# Patient Record
Sex: Female | Born: 1980 | Race: White | Hispanic: No | Marital: Married | Smoking: Never smoker
Health system: Southern US, Academic
[De-identification: ages and names within clinical notes are randomized; demographics above are authoritative.]

## PROBLEM LIST (undated history)

## (undated) DIAGNOSIS — Z8742 Personal history of other diseases of the female genital tract: Secondary | ICD-10-CM

## (undated) DIAGNOSIS — M109 Gout, unspecified: Secondary | ICD-10-CM

## (undated) DIAGNOSIS — I10 Essential (primary) hypertension: Secondary | ICD-10-CM

## (undated) HISTORY — PX: HX CARPAL TUNNEL RELEASE: SHX101

## (undated) HISTORY — PX: BARIATRIC SURGERY: SHX1103

---

## 1999-12-27 ENCOUNTER — Emergency Department (HOSPITAL_COMMUNITY): Payer: Self-pay | Admitting: EXTERNAL

## 2015-08-29 IMAGING — CT CT HEAD/BRAIN W/O DYE
1 of 2 series · 16 of 30 positions shown, 20 images · non-contrast
Comparison: None.

Exam:
CT Head without Contrast
INDICATION: Migraine headaches.
TECHNIQUE: Axial non-contrast CT imaging was performed from skull base to the vertex.

[axial · axial · 0.49mm/px · z∈[-76,+68]mm · 16 of 56 slices shown, 20 images]
[im 4/56  brain]
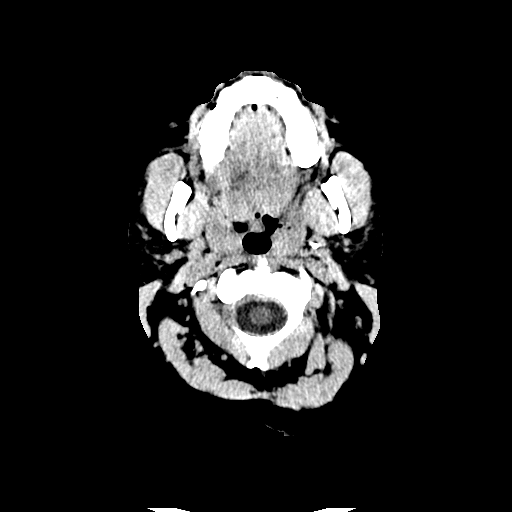
[im 4/56  bone]
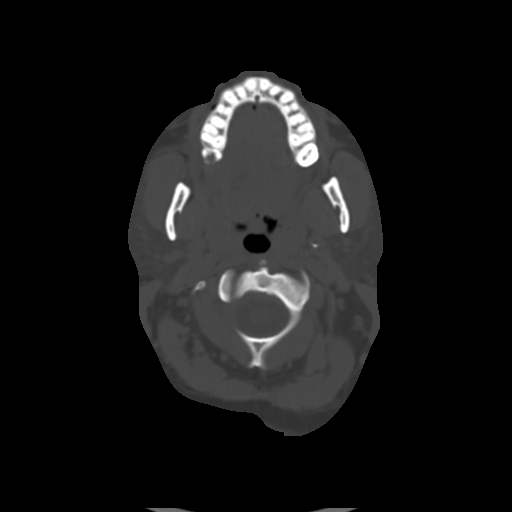
[im 7/56  brain]
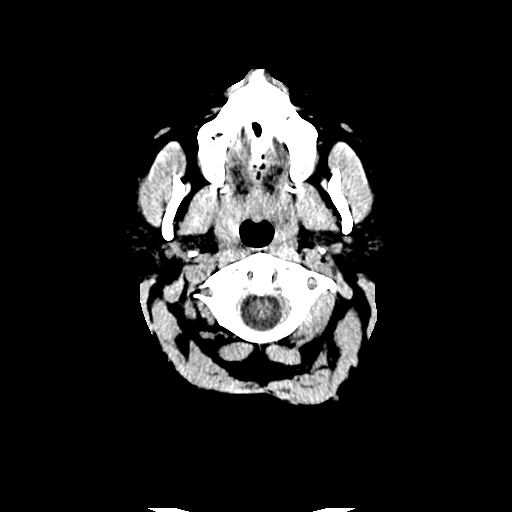
[im 10/56  brain]
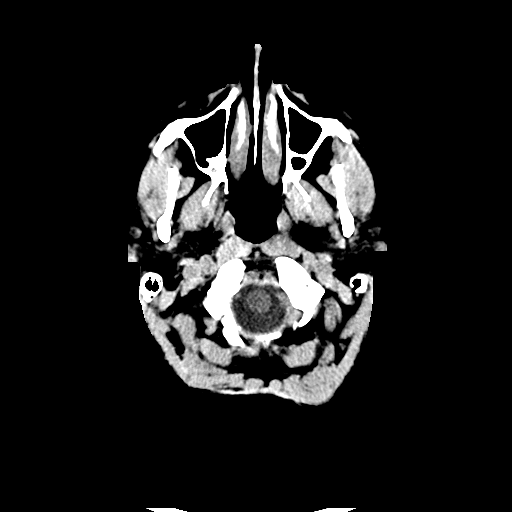
[im 13/56  brain]
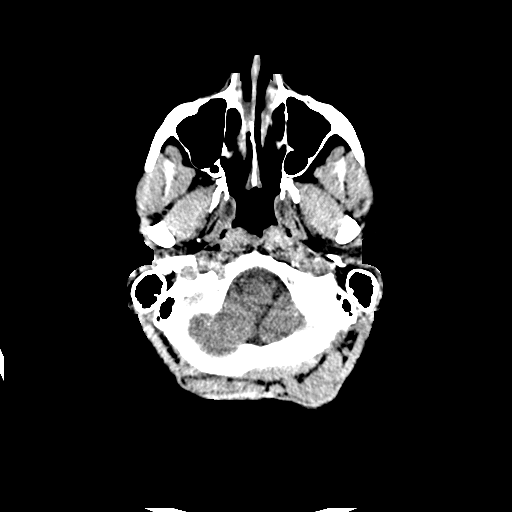
[im 16/56  brain]
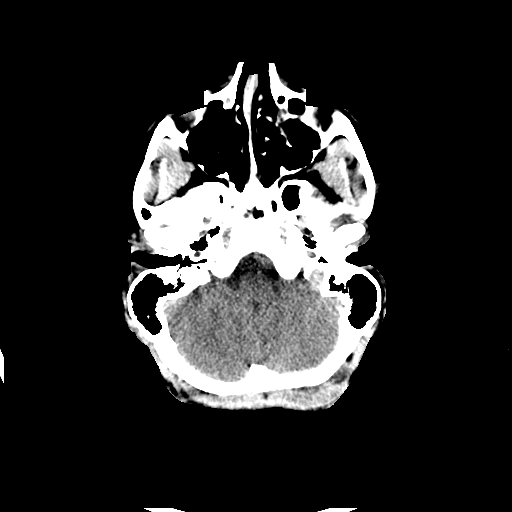
[im 16/56  bone]
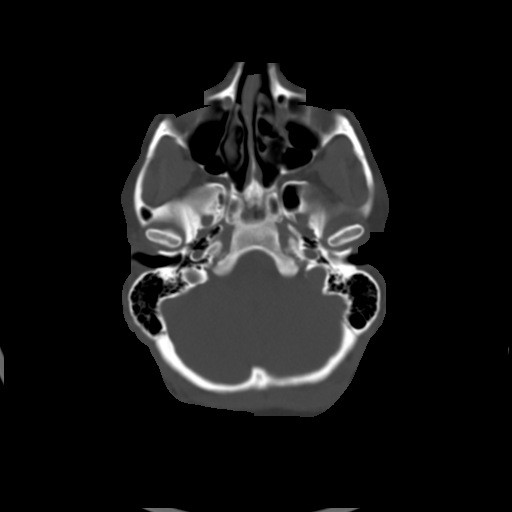
[im 19/56  brain]
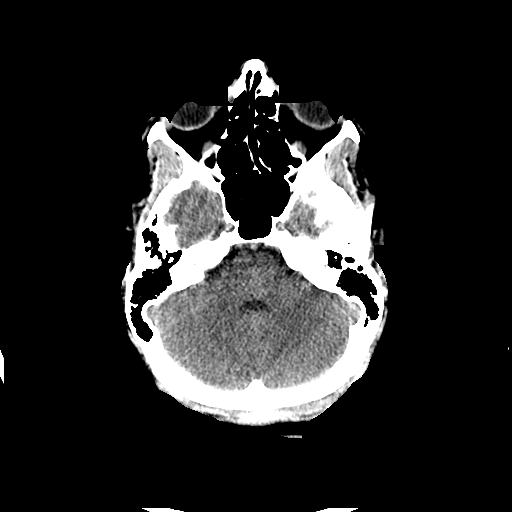
[im 22/56  brain]
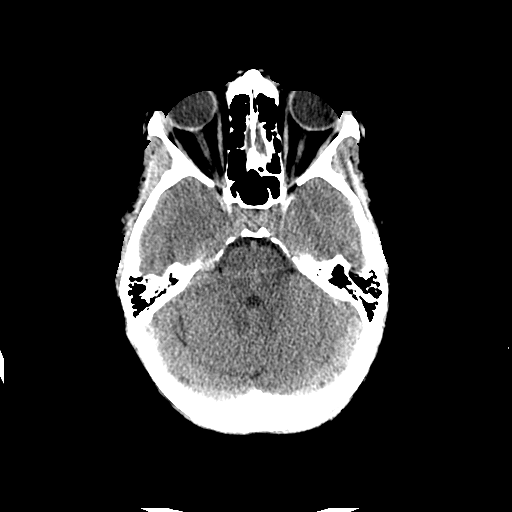
[im 25/56  brain]
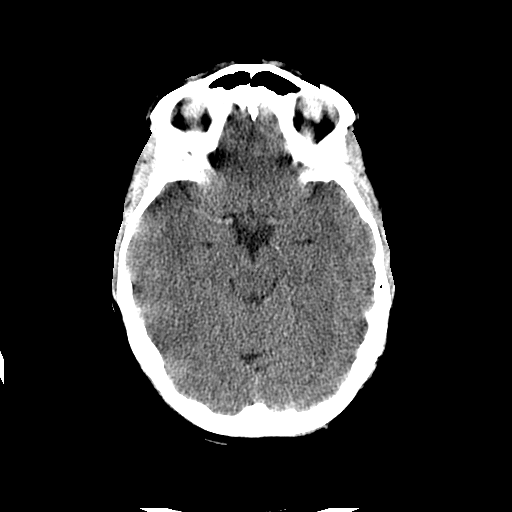
[im 31/56  brain]
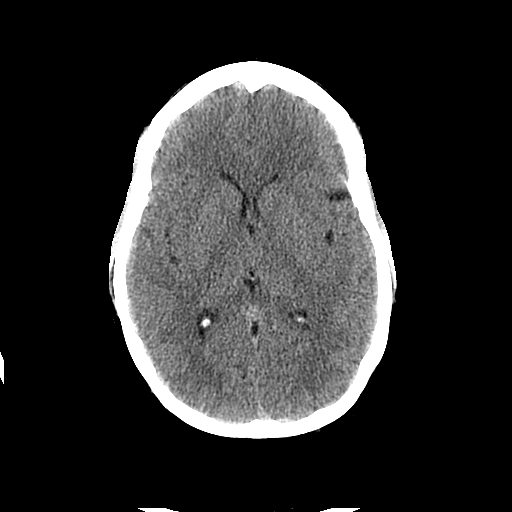
[im 31/56  bone]
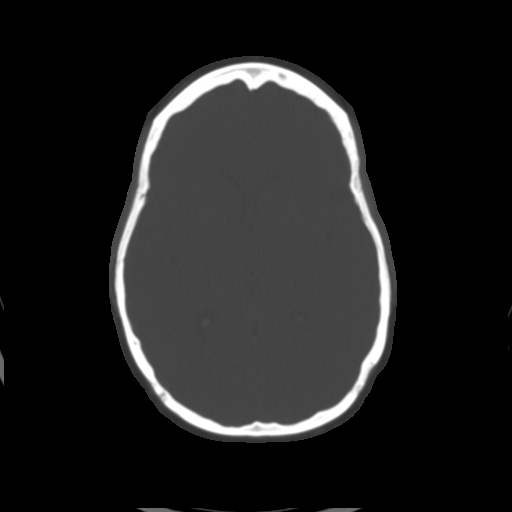
[im 34/56  brain]
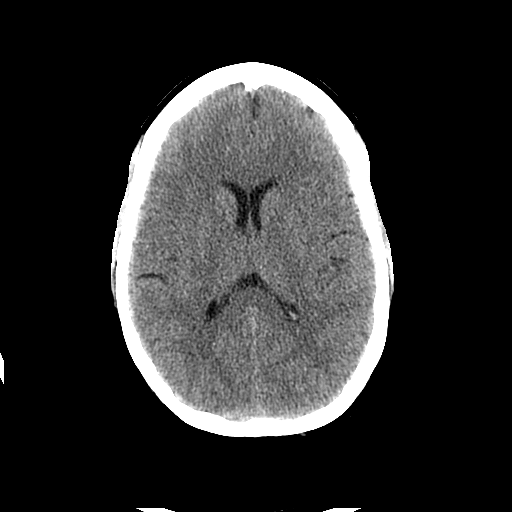
[im 37/56  brain]
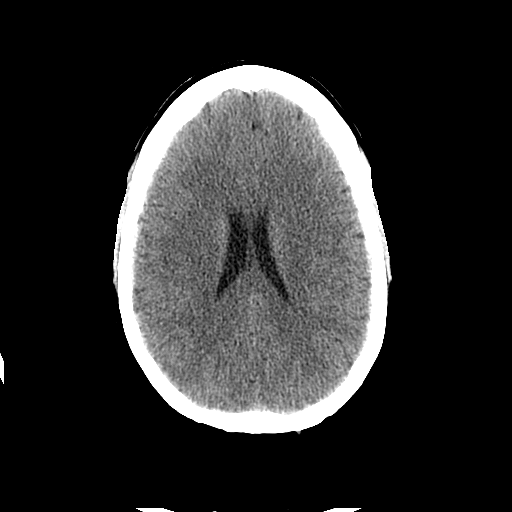
[im 40/56  brain]
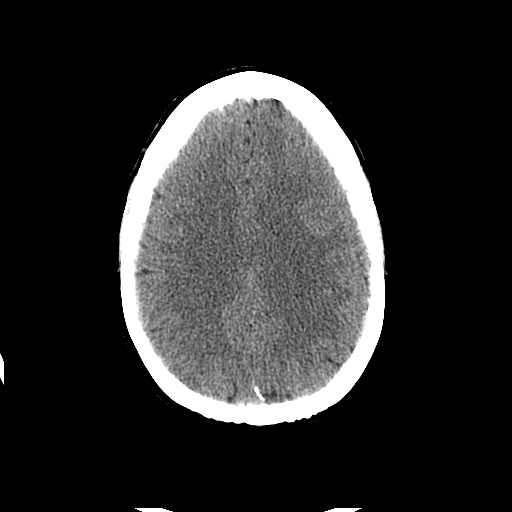
[im 43/56  brain]
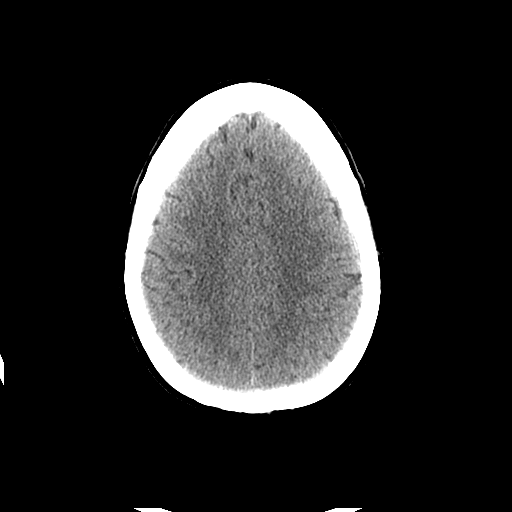
[im 43/56  bone]
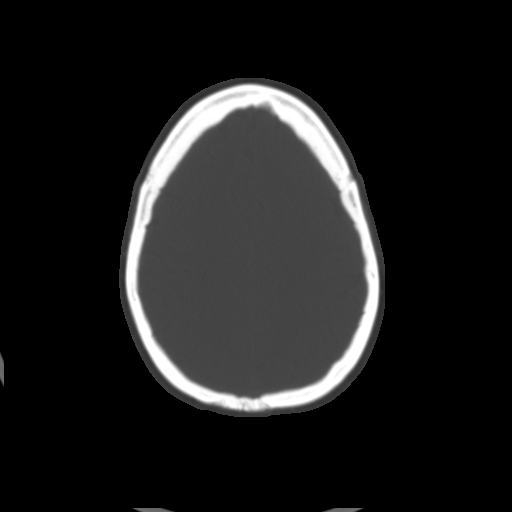
[im 46/56  brain]
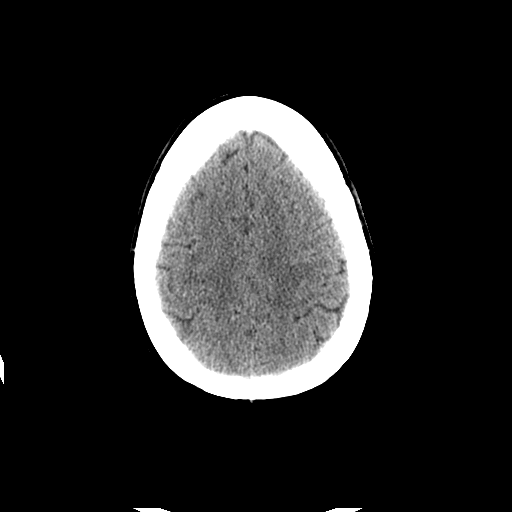
[im 49/56  brain]
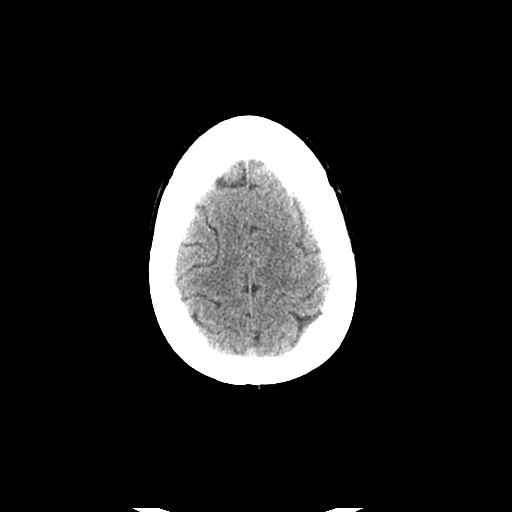
[im 52/56  brain]
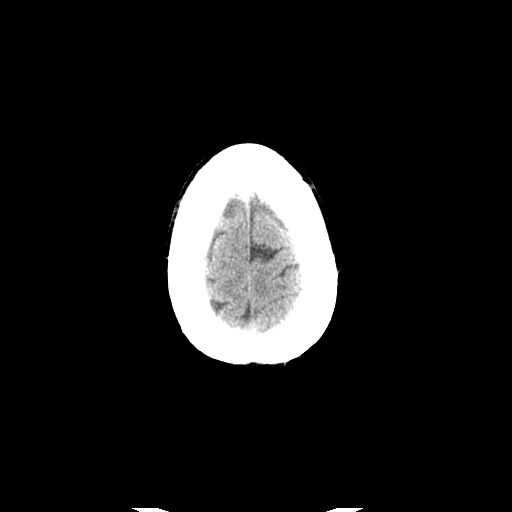

[16 of 30 positions shown; findings below may reference images not displayed]

FINDINGS: Ventricular and sulcal size is normal for the patients age.  There is no mass effect, midline shift or intracranial hemorrhage.  There is no evidence of acute infarction.  There are no extra-axial fluid collections.  Visualized paranasal sinuses, mastoid air cells and orbital contents are unremarkable.
IMPRESSION: 1.
No acute intracranial abnormality.  

d/t:08/29/2015 [DATE]/ 08/29/2015 [DATE] AMCL

## 2019-03-14 IMAGING — MR MRI LOWER EXTREMITY WITHOUT CONTRAST RT
4 of 6 series · 19 of 48 positions shown · IV contrast (gadolinium)
Comparison: None available.

EXAM:  MRI LOWER EXTREMITY WITHOUT CONTRAST RT
INDICATION: Right foot pain for three weeks.
TECHNIQUE: Multiplanar multisequential MRI of the right foot was performed without gadolinium contrast.

[Series 7: STIR · sagittal · 3.0mm · 0.49mm/px · 3 of 22 slices shown]
[im 4/22]
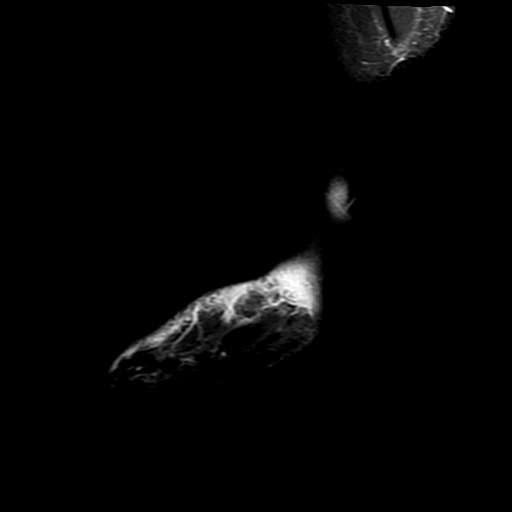
[im 11/22]
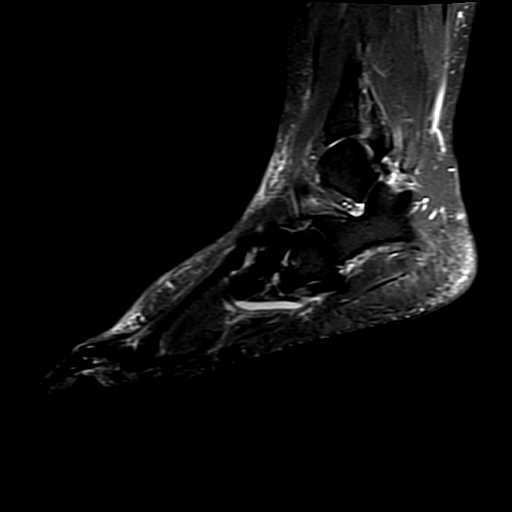
[im 18/22]
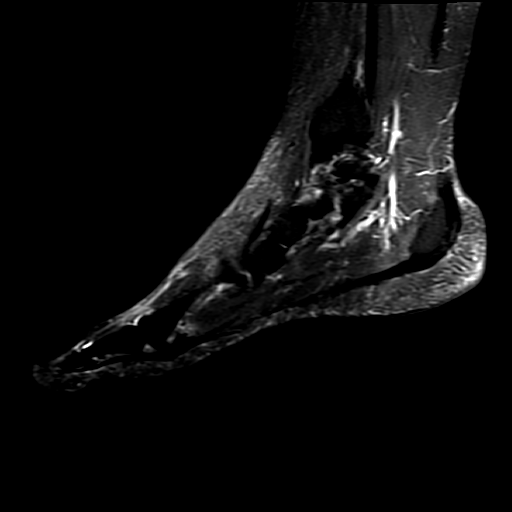

[Series 10: T1 · sagittal · 3.0mm · 0.49mm/px · 7 of 22 slices shown (1 of 3)]
[im 1/22]
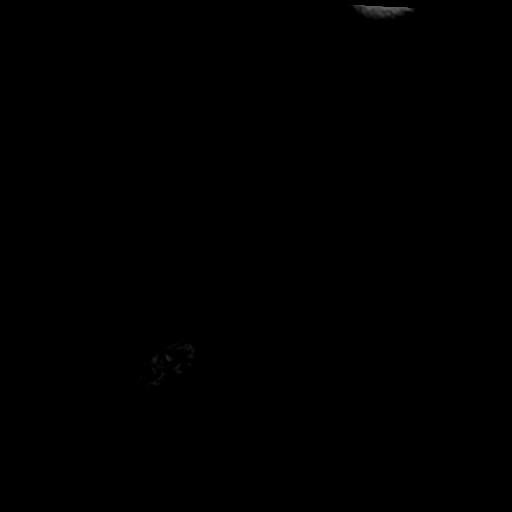
[im 4/22]
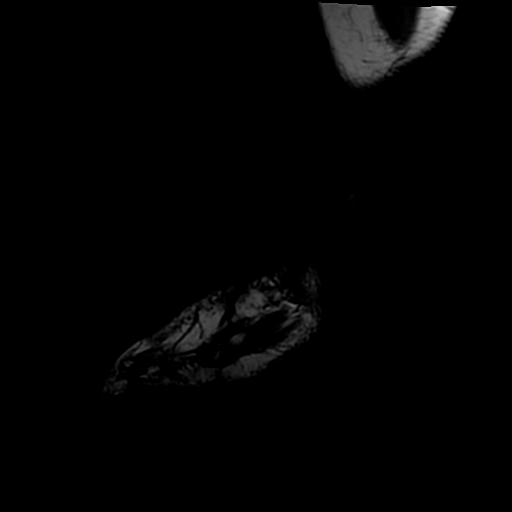
[im 8/22]
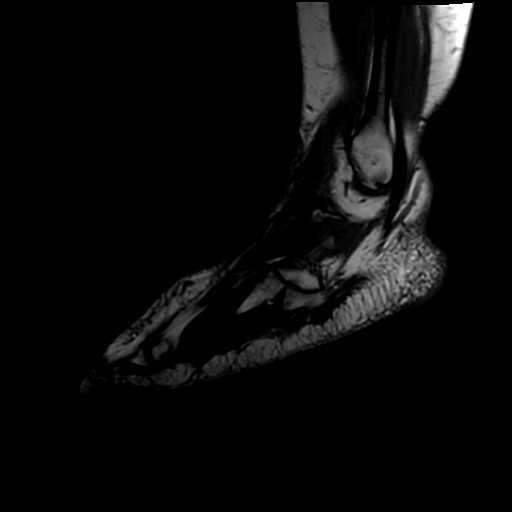
[im 11/22]
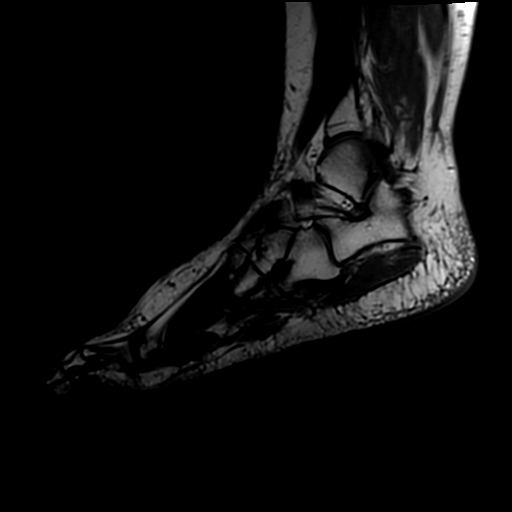
[im 15/22]
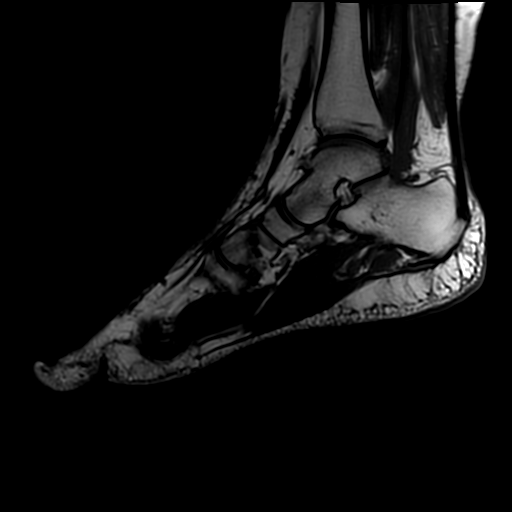
[im 18/22]
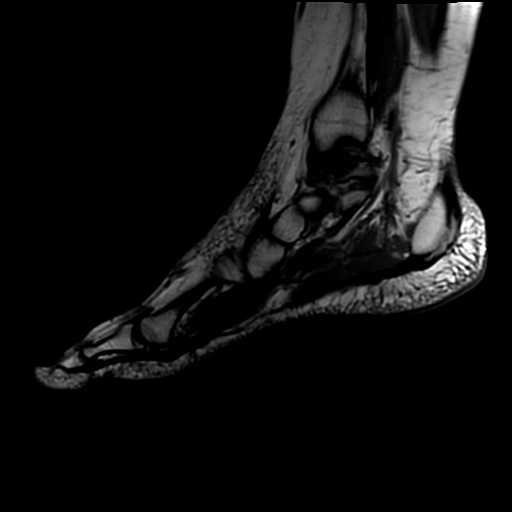
[im 22/22]
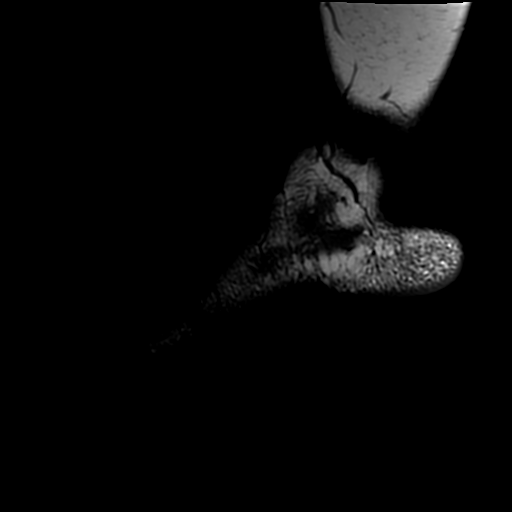

[Series 12: T1 · coronal · 5.0mm · 0.29mm/px · 6 of 32 slices shown (2 of 3)]
[im 1/32]
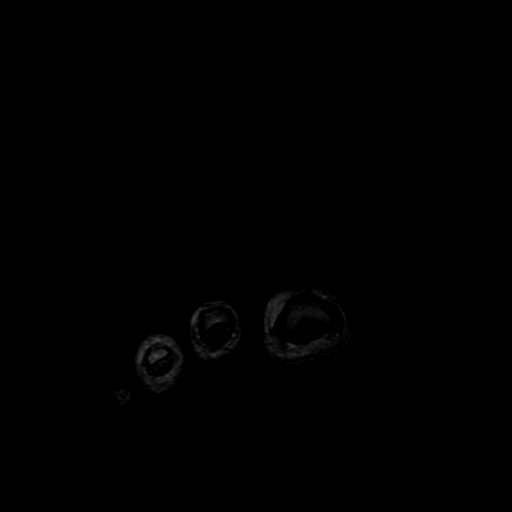
[im 4/32]
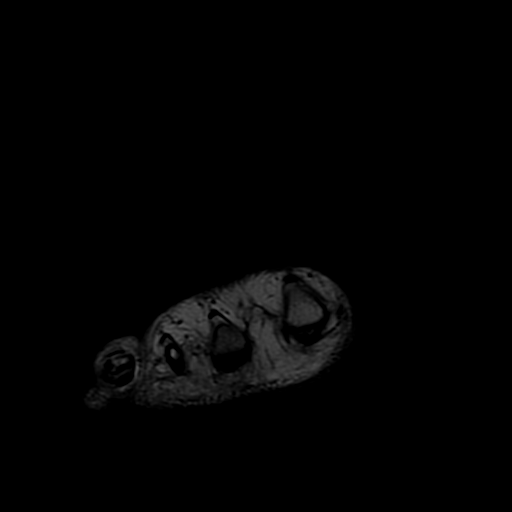
[im 11/32]
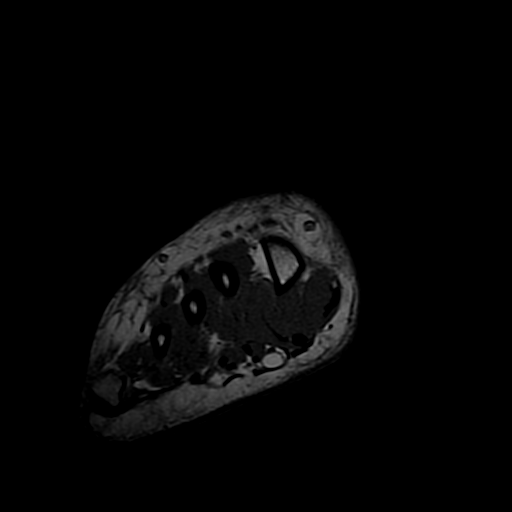
[im 14/32]
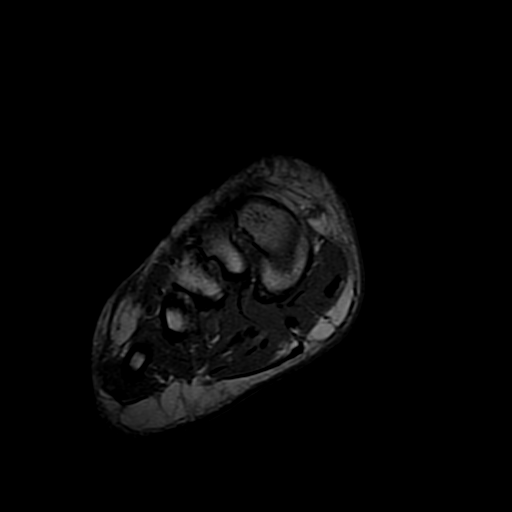
[im 18/32]
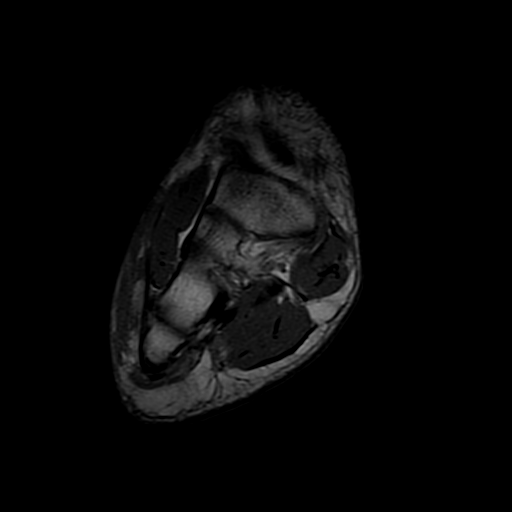
[im 28/32]
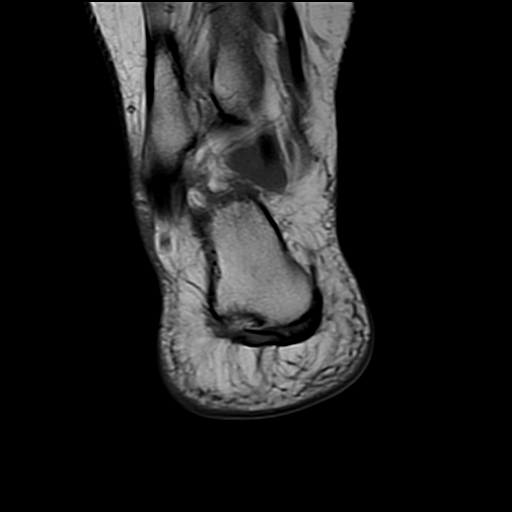

[Series 13: T1 · axial · 3.0mm · 0.49mm/px · z∈[-102,-51]mm · 3 of 22 slices shown (3 of 3)]
[im 4/22]
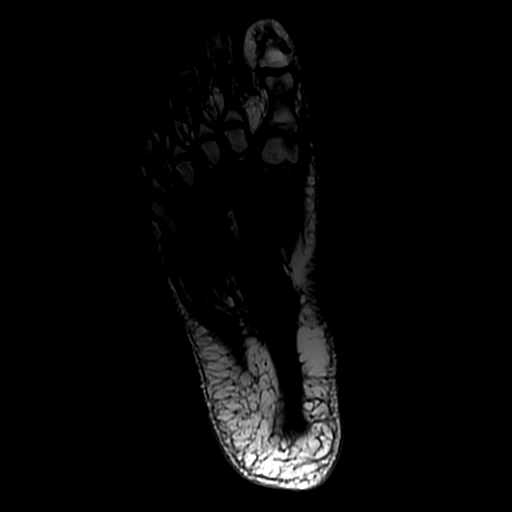
[im 11/22]
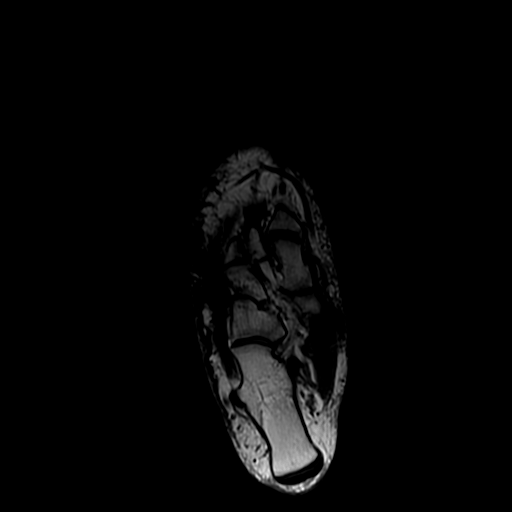
[im 18/22]
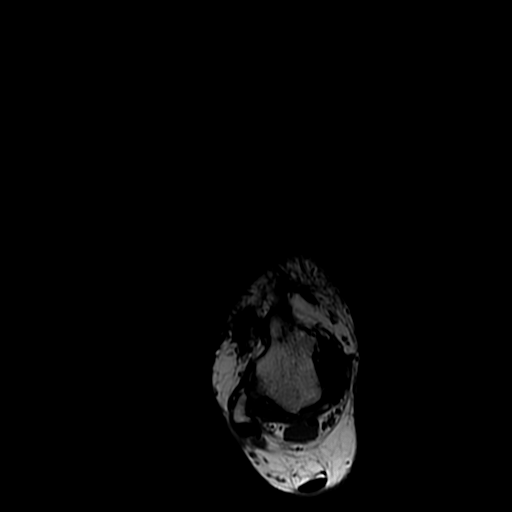

[19 of 48 positions shown; findings below may reference images not displayed]

FINDINGS: There is no acute fracture or subluxation. There is no osteochondral lesion of the talar dome. There is suggestion of an accessory navicular bone with change in T1 signal intensity within the bone, synchondrosis and adjacent soft tissues. There is also abnormal thickening of the tibialis posterior tendon at the level of the accessory navicular bone with small amount of fluid within the tendon sheath. Normal T1 signal intensity is seen within the sinus tarsi. Extensor and peroneal tendons are normal. Achilles’ tendon is unremarkable. Intrinsic muscles of the forefoot are normal without definite mass or evidence of denervation atrophy. The plantar aponeurosis is normal without fasciitis, fibromatosis or tear. There is also moderate dorsal subcutaneous edema.
IMPRESSION: Imaging findings compatible with chronic stress related injury of accessory navicular bone as detailed above. There is also evidence of tibialis posterior tendinopathy in association with the accessory navicular bone. 

Moderate dorsal subcutaneous edema.

## 2022-02-27 ENCOUNTER — Encounter (HOSPITAL_COMMUNITY): Payer: Self-pay

## 2022-02-27 ENCOUNTER — Other Ambulatory Visit: Payer: Self-pay

## 2022-02-27 ENCOUNTER — Emergency Department
Admission: EM | Admit: 2022-02-27 | Discharge: 2022-02-27 | Disposition: A | Discharge status: Home / Self Care | Payer: 59 | Attending: PHYSICIAN ASSISTANT | Admitting: PHYSICIAN ASSISTANT

## 2022-02-27 ENCOUNTER — Emergency Department (HOSPITAL_COMMUNITY): Payer: 59

## 2022-02-27 DIAGNOSIS — I10 Essential (primary) hypertension: Secondary | ICD-10-CM | POA: Insufficient documentation

## 2022-02-27 DIAGNOSIS — E282 Polycystic ovarian syndrome: Secondary | ICD-10-CM | POA: Insufficient documentation

## 2022-02-27 DIAGNOSIS — K760 Fatty (change of) liver, not elsewhere classified: Secondary | ICD-10-CM | POA: Insufficient documentation

## 2022-02-27 DIAGNOSIS — K529 Noninfective gastroenteritis and colitis, unspecified: Secondary | ICD-10-CM | POA: Insufficient documentation

## 2022-02-27 DIAGNOSIS — K7689 Other specified diseases of liver: Secondary | ICD-10-CM | POA: Insufficient documentation

## 2022-02-27 HISTORY — DX: Essential (primary) hypertension: I10

## 2022-02-27 HISTORY — DX: Personal history of other diseases of the female genital tract: Z87.42

## 2022-02-27 LAB — COMPREHENSIVE METABOLIC PANEL, NON-FASTING
ALBUMIN/GLOBULIN RATIO: 1.1 (ref 0.8–1.4)
ALBUMIN: 4.3 g/dL (ref 3.5–5.7)
ALKALINE PHOSPHATASE: 59 U/L (ref 34–104)
ALT (SGPT): 53 U/L — ABNORMAL HIGH (ref 7–52)
ANION GAP: 11 mmol/L (ref 10–20)
AST (SGOT): 35 U/L (ref 13–39)
BILIRUBIN TOTAL: 0.8 mg/dL (ref 0.3–1.2)
BUN/CREA RATIO: 16 (ref 6–22)
BUN: 12 mg/dL (ref 7–25)
CALCIUM, CORRECTED: 9 mg/dL (ref 8.9–10.8)
CALCIUM: 9.3 mg/dL (ref 8.6–10.3)
CHLORIDE: 106 mmol/L (ref 98–107)
CO2 TOTAL: 21 mmol/L (ref 21–31)
CREATININE: 0.77 mg/dL (ref 0.60–1.30)
ESTIMATED GFR: 99 mL/min/{1.73_m2} (ref >59)
GLOBULIN: 3.9 (ref 2.9–5.4)
GLUCOSE: 158 mg/dL — ABNORMAL HIGH (ref 74–109)
OSMOLALITY, CALCULATED: 279 mOsm/kg (ref 270–290)
POTASSIUM: 4 mmol/L (ref 3.5–5.1)
PROTEIN TOTAL: 8.2 g/dL (ref 6.4–8.9)
SODIUM: 138 mmol/L (ref 136–145)

## 2022-02-27 LAB — CBC WITH DIFF
BASOPHIL #: 0 10*3/uL (ref 0.00–0.30)
BASOPHIL %: 0 % (ref 0–3)
EOSINOPHIL #: 0.1 10*3/uL (ref 0.00–0.80)
EOSINOPHIL %: 1 % (ref 0–7)
HCT: 49.8 % — ABNORMAL HIGH (ref 37.0–47.0)
HGB: 16.7 g/dL — ABNORMAL HIGH (ref 12.5–16.0)
LYMPHOCYTE #: 2.1 10*3/uL (ref 1.10–5.00)
LYMPHOCYTE %: 19 % — ABNORMAL LOW (ref 25–45)
MCH: 29.9 pg (ref 27.0–32.0)
MCHC: 33.5 g/dL (ref 32.0–36.0)
MCV: 89.4 fL (ref 78.0–99.0)
MONOCYTE #: 0.9 10*3/uL (ref 0.00–1.30)
MONOCYTE %: 8 % (ref 0–12)
MPV: 7.4 fL (ref 7.4–10.4)
NEUTROPHIL #: 7.9 10*3/uL (ref 1.80–8.40)
NEUTROPHIL %: 72 % (ref 40–76)
PLATELETS: 357 10*3/uL (ref 140–440)
RBC: 5.57 10*6/uL — ABNORMAL HIGH (ref 4.20–5.40)
RDW: 13.5 % (ref 11.6–14.8)
WBC: 11.1 10*3/uL — ABNORMAL HIGH (ref 4.0–10.5)
WBCS UNCORRECTED: 11.1 10*3/uL

## 2022-02-27 LAB — BLOOD CULTURE HOLD

## 2022-02-27 LAB — GOLD TOP TUBE

## 2022-02-27 LAB — LIPASE: LIPASE: 46 U/L (ref 11–82)

## 2022-02-27 LAB — LACTIC ACID LEVEL W/ REFLEX FOR LEVEL >2.0: LACTIC ACID: 1.6 mmol/L (ref 0.5–2.2)

## 2022-02-27 LAB — C. DIFFICILE PCR
C. DIFFICILE TOXIN GENE, PCR: NEGATIVE
PRESUMPTIVE 027/NAP1/BI: NEGATIVE

## 2022-02-27 MED ORDER — ONDANSETRON 4 MG DISINTEGRATING TABLET
4.0000 mg | ORAL_TABLET | Freq: Three times a day (TID) | ORAL | 0 refills | Status: AC | PRN
Start: 2022-02-27 — End: ?

## 2022-02-27 MED ORDER — DICYCLOMINE 10 MG CAPSULE
10.0000 mg | ORAL_CAPSULE | Freq: Four times a day (QID) | ORAL | 0 refills | Status: AC
Start: 2022-02-27 — End: 2022-03-09

## 2022-02-27 NOTE — ED Provider Notes (Signed)
[facility]  ED Primary Provider Note  History of Present Illness   Chief Complaint   Patient presents with   . Abdominal Pain   . Diarrhea   . Nausea     Taylor Beasley is a 41 y.o. female who had concerns including Abdominal Pain, Diarrhea, and Nausea.  Arrival: The patient arrived by Car    This is a 41 y/o WF with a PMHx of HTN, PCOS,and pre-diabetes who presents to the ER complaining of abdominal pain and diarrhea x 1 day. Diarrhea began 1 day PTA 2 hours after pt ate breakfast, which consisted of a donut. Denies any blood in diarrhea and describes the appearance as watery and yellow appearing. Pt has tried three different anti-diarrheals which did not help to relieve her symptoms. Pt is also experiencing abdominal pain that she localizes to her RUQ and radiates to her umbilicus. Pain is described as crampy and is currently rated as a 1/10, but has gotten to a 8/10 in the past. Pt is also experiencing gas and belching. Denies N/V, F/C, SOB, chest pain, urinary complaints, or URI symptoms. Pt had a similar episode of these symptoms the week PTA that began after eating a hot dog and has since resolved. Denies history of recent antibiotic usage. 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 Denies any abdominal surgeries. Denies smoking, alcohol, or illicit drug usage.        Review of Systems   Pertinent positive and negative ROS as per HPI.  Historical Data   History Reviewed This Encounter: Medical History  Surgical History  Family History  Social History      Physical Exam   ED Triage Vitals   BP (Non-Invasive) 02/27/22 0828 (!) 140/103   Heart Rate 02/27/22 0828 94   Respiratory Rate 02/27/22 1035 18   Temperature 02/27/22 1035 36.1 C (97 F)   SpO2 02/27/22 0828 98 %   Weight 02/27/22 0828 (!) 152 kg (334 lb)   Height 02/27/22 0828 1.651 m ('5\' 5"'$ )     Physical Exam  Vitals reviewed.   Constitutional:       General: She is not in acute distress.      Appearance: Normal appearance. She is well-developed. She is obese.   HENT:      Head: Normocephalic and atraumatic.   Eyes:      Conjunctiva/sclera: Conjunctivae normal.   Cardiovascular:      Rate and Rhythm: Normal rate and regular rhythm.      Heart sounds: No murmur heard.  Pulmonary:      Effort: Pulmonary effort is normal. No respiratory distress.      Breath sounds: Normal breath sounds.   Abdominal:      Palpations: Abdomen is soft.      Tenderness: There is no abdominal tenderness.   Musculoskeletal:         General: No swelling.      Cervical back: Neck supple.   Skin:     General: Skin is warm and dry.      Capillary Refill: Capillary refill takes less than 2 seconds.   Neurological:      Mental Status: She is alert.   Psychiatric:         Mood and Affect: Mood normal.       Patient Data     Labs Ordered/Reviewed   COMPREHENSIVE METABOLIC PANEL, NON-FASTING - Abnormal; Notable for the following components:  Result Value    GLUCOSE 158 (*)     ALT (SGPT) 53 (*)     All other components within normal limits    Narrative:     Estimated Glomerular Filtration Rate (eGFR) is calculated using the CKD-EPI (2021) equation, intended for patients 74 years of age and older. If gender is not documented or "unknown", there will be no eGFR calculation.   CBC WITH DIFF - Abnormal; Notable for the following components:    WBC 11.1 (*)     RBC 5.57 (*)     HGB 16.7 (*)     HCT 49.8 (*)     LYMPHOCYTE % 19 (*)     All other components within normal limits   LIPASE - Normal   LACTIC ACID LEVEL W/ REFLEX FOR LEVEL >2.0 - Normal   BLOOD CULTURE HOLD   C. DIFFICILE PCR   CBC/DIFF    Narrative:     The following orders were created for panel order CBC/DIFF.  Procedure                               Abnormality         Status                     ---------                               -----------         ------                     CBC WITH OZHY[[id number]]                Abnormal            Final result                 Please  view results for these tests on the individual orders.   EXTRA TUBES    Narrative:     The following orders were created for panel order EXTRA TUBES.  Procedure                               Abnormality         Status                     ---------                               -----------         ------                     GOLD TOP EXBM[[id number]]                                    In process                 BLOOD CULTURE HOLD[[id number]]                               In process  Please view results for these tests on the individual orders.   GOLD TOP TUBE     US GALLBLADDER   Final Result by Edi, Radresults In (06/03 1023)   NO CHOLELITHIASIS OR BILIARY DILATATION      HEPATIC FATTY INFILTRATION      LEFT LOBE HEPATIC CYST            Radiologist location ID: [facility]        Medical Decision Making  Patient had presented for right upper quadrant abdominal pain with nausea vomiting diarrhea.  She is had 2 episodes of this over the last week or so.  This has been associated with eating.  Patient still had her gallbladder.  Lab work shows mildly elevated white count as well as mildly elevated ALT.  Gallbladder ultrasound was ordered for further evaluation.  Ultrasound shows fatty liver disease but no cholecystitis no cholelithiasis.  It is still possible that patient may be having biliary dyskinesia given her symptoms.  She does have multiple risk factors including age female sex and obesity.  Patient has had a HIDA scan in the past but can not remember what her ejection fraction was at that time.  Patient did have episode of diarrhea here in the ED.  That will be sent for testing.  They are not feel the patient needs to wait on the results as we can call her in antibiotics if necessary.    Amount and/or Complexity of Data Reviewed  Labs: ordered.  Radiology: ordered.                  Clinical Impression   Gastroenteritis (Primary)       Disposition: Discharged

## 2022-02-27 NOTE — ED Nurses Note (Signed)
Patient discharged home with family.  AVS reviewed with patient/care giver.  A written copy of the AVS and discharge instructions was given to the patient/care giver.  Questions sufficiently answered as needed.  Patient/care giver encouraged to follow up with PCP as indicated.  In the event of an emergency, patient/care giver instructed to call 911 or go to the nearest emergency room.

## 2022-02-27 NOTE — ED Triage Notes (Signed)
Patient reports she has been experiencing abd pain, nausea, and diarrhea since last night. Reports hx of similar sx last week w/ suspected food poisoning. Denies any vomiting.

## 2022-02-27 NOTE — Discharge Instructions (Signed)
Drink plenty of fluids. Bland diet as tolerated. Continue any at home medications as previously prescribed. Take medications that are prescribed from today's visit as prescribed. Discuss any questions you may have concerning your medications with your pharmacist. If you continue to have symptoms than further evaluation with a HIDA scan may be beneficial. Follow up with your regular PCP in the next 2-3 days. Return to the ED if symptoms worsen, change, or do not improve.

## 2022-03-15 ENCOUNTER — Other Ambulatory Visit (HOSPITAL_COMMUNITY): Payer: Self-pay | Admitting: Family

## 2022-03-15 ENCOUNTER — Inpatient Hospital Stay
Admission: RE | Admit: 2022-03-15 | Discharge: 2022-03-15 | Disposition: A | Discharge status: Home / Self Care | Payer: 59 | Source: Ambulatory Visit | Attending: Family | Admitting: Family

## 2022-03-15 ENCOUNTER — Other Ambulatory Visit: Payer: Self-pay

## 2022-03-15 DIAGNOSIS — R197 Diarrhea, unspecified: Secondary | ICD-10-CM

## 2022-04-13 ENCOUNTER — Other Ambulatory Visit (HOSPITAL_COMMUNITY): Payer: Self-pay | Admitting: Family

## 2022-04-13 DIAGNOSIS — R197 Diarrhea, unspecified: Secondary | ICD-10-CM

## 2022-04-13 DIAGNOSIS — R109 Unspecified abdominal pain: Secondary | ICD-10-CM

## 2022-04-27 ENCOUNTER — Inpatient Hospital Stay
Admission: RE | Admit: 2022-04-27 | Discharge: 2022-04-27 | Disposition: A | Discharge status: Home / Self Care | Payer: 59 | Source: Ambulatory Visit | Attending: Family | Admitting: Family

## 2022-04-27 ENCOUNTER — Other Ambulatory Visit: Payer: Self-pay

## 2022-04-27 DIAGNOSIS — R109 Unspecified abdominal pain: Secondary | ICD-10-CM | POA: Insufficient documentation

## 2022-04-27 DIAGNOSIS — R197 Diarrhea, unspecified: Secondary | ICD-10-CM | POA: Insufficient documentation

## 2022-04-27 MED ORDER — SINCALIDE 5 MCG SOLUTION FOR INJECTION
3.2000 ug | INTRAMUSCULAR | Status: DC
Start: 2022-04-27 — End: 2022-04-28

## 2022-04-27 MED ORDER — SINCALIDE 5 MCG SOLUTION FOR INJECTION
INTRAMUSCULAR | Status: AC
Start: 2022-04-27 — End: 2022-04-27
  Filled 2022-04-27: qty 5

## 2022-06-07 ENCOUNTER — Other Ambulatory Visit: Payer: Self-pay

## 2022-06-07 ENCOUNTER — Inpatient Hospital Stay
Admission: RE | Admit: 2022-06-07 | Discharge: 2022-06-07 | Disposition: A | Discharge status: Home / Self Care | Payer: 59 | Source: Ambulatory Visit | Attending: Family | Admitting: Family

## 2022-06-07 ENCOUNTER — Other Ambulatory Visit (HOSPITAL_COMMUNITY): Payer: Self-pay | Admitting: Family

## 2022-06-07 DIAGNOSIS — R52 Pain, unspecified: Secondary | ICD-10-CM

## 2022-06-08 ENCOUNTER — Other Ambulatory Visit (INDEPENDENT_AMBULATORY_CARE_PROVIDER_SITE_OTHER): Payer: Self-pay | Admitting: Family

## 2022-06-08 DIAGNOSIS — J309 Allergic rhinitis, unspecified: Secondary | ICD-10-CM

## 2022-06-23 ENCOUNTER — Ambulatory Visit (HOSPITAL_COMMUNITY): Payer: Self-pay

## 2022-06-23 ENCOUNTER — Inpatient Hospital Stay
Admission: RE | Admit: 2022-06-23 | Discharge: 2022-06-23 | Disposition: A | Discharge status: Home / Self Care | Payer: No Typology Code available for payment source | Source: Ambulatory Visit | Attending: Family | Admitting: Family

## 2022-06-23 ENCOUNTER — Other Ambulatory Visit (HOSPITAL_COMMUNITY): Payer: Self-pay | Admitting: Family

## 2022-06-23 ENCOUNTER — Other Ambulatory Visit: Payer: Self-pay

## 2022-06-23 DIAGNOSIS — S93409A Sprain of unspecified ligament of unspecified ankle, initial encounter: Secondary | ICD-10-CM | POA: Insufficient documentation

## 2022-07-19 ENCOUNTER — Other Ambulatory Visit (INDEPENDENT_AMBULATORY_CARE_PROVIDER_SITE_OTHER): Payer: Self-pay | Admitting: Family

## 2022-07-25 ENCOUNTER — Other Ambulatory Visit: Payer: Self-pay

## 2022-07-25 ENCOUNTER — Other Ambulatory Visit (HOSPITAL_COMMUNITY): Payer: Self-pay | Admitting: Family

## 2022-07-25 ENCOUNTER — Inpatient Hospital Stay
Admission: RE | Admit: 2022-07-25 | Discharge: 2022-07-25 | Disposition: A | Discharge status: Home / Self Care | Payer: No Typology Code available for payment source | Source: Ambulatory Visit | Attending: Family | Admitting: Family

## 2022-07-25 DIAGNOSIS — M25572 Pain in left ankle and joints of left foot: Secondary | ICD-10-CM

## 2022-08-16 ENCOUNTER — Other Ambulatory Visit (HOSPITAL_COMMUNITY): Payer: Self-pay

## 2022-08-16 DIAGNOSIS — Z01818 Encounter for other preprocedural examination: Secondary | ICD-10-CM

## 2022-09-06 ENCOUNTER — Other Ambulatory Visit (HOSPITAL_COMMUNITY): Payer: Self-pay | Admitting: Surgery

## 2022-09-06 ENCOUNTER — Other Ambulatory Visit: Payer: Self-pay

## 2022-09-06 ENCOUNTER — Inpatient Hospital Stay (HOSPITAL_BASED_OUTPATIENT_CLINIC_OR_DEPARTMENT_OTHER)
Admission: RE | Admit: 2022-09-06 | Discharge: 2022-09-06 | Disposition: A | Discharge status: Home / Self Care | Payer: 59 | Source: Ambulatory Visit | Attending: Surgery | Admitting: Surgery

## 2022-09-06 ENCOUNTER — Inpatient Hospital Stay
Admission: RE | Admit: 2022-09-06 | Discharge: 2022-09-06 | Disposition: A | Discharge status: Home / Self Care | Payer: 59 | Source: Ambulatory Visit | Attending: Surgery | Admitting: Surgery

## 2022-09-06 DIAGNOSIS — R5383 Other fatigue: Secondary | ICD-10-CM | POA: Insufficient documentation

## 2022-09-06 DIAGNOSIS — R635 Abnormal weight gain: Secondary | ICD-10-CM

## 2022-09-06 DIAGNOSIS — Z01818 Encounter for other preprocedural examination: Secondary | ICD-10-CM | POA: Insufficient documentation

## 2022-09-07 IMAGING — MR MRI ANKLE LT WO CONTAST
4 of 6 series · 22 of 40 positions shown · IV contrast (gadolinium)
Comparison: No prior imaging studies of the ankle and foot are available for comparison.

﻿EXAM:  [DATE]   MRI ANKLE LT WO CONTAST,MRI FOOT LT WO CONTRAST
INDICATION: 41-year-old female sustained twisting injury recently.  Pain and swelling of the lateral aspect of the ankle and medial aspect of the foot.  No prior surgery.
TECHNIQUE: Multiplanar, multisequential MRI of the left ankle and left foot was performed without gadolinium contrast.  Overall visualization is limited by motion artifacts on multiple sequences. Some of the sequences were repeated.  Quality of the study acceptable for interpretation.

[Series 8: T1 · sagittal · left · 3.5mm · 0.38mm/px · 5 of 20 slices shown (1 of 3)]
[im 1/20]
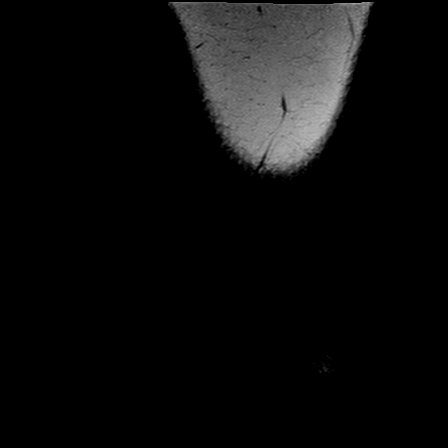
[im 5/20]
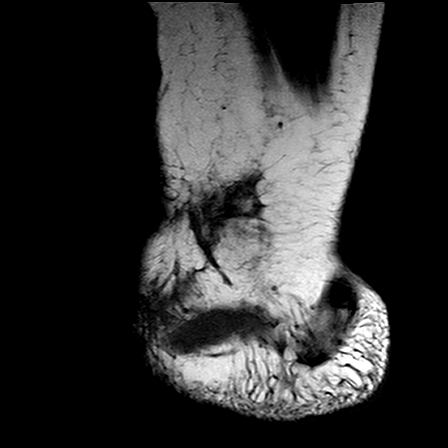
[im 10/20]
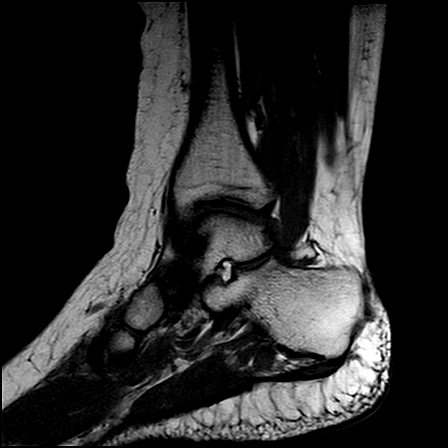
[im 15/20]
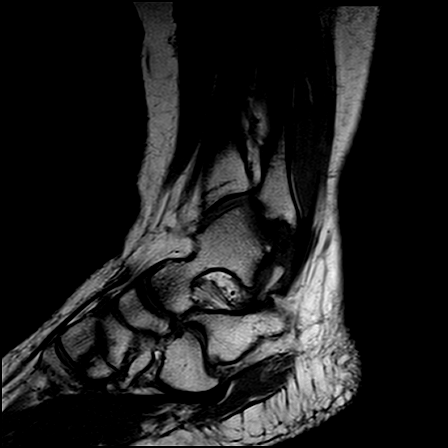
[im 20/20]
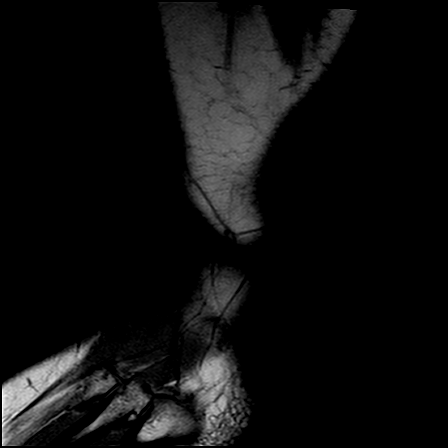

[Series 10: T1 · axial · left · 4.5mm · 0.29mm/px · z∈[-36,+80]mm · 6 of 28 slices shown (2 of 3)]
[im 1/28]
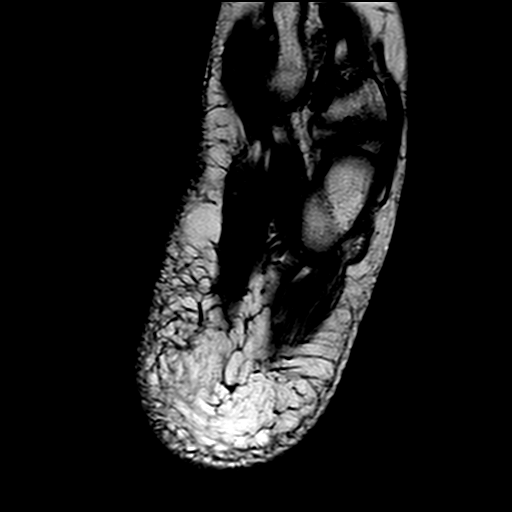
[im 4/28]
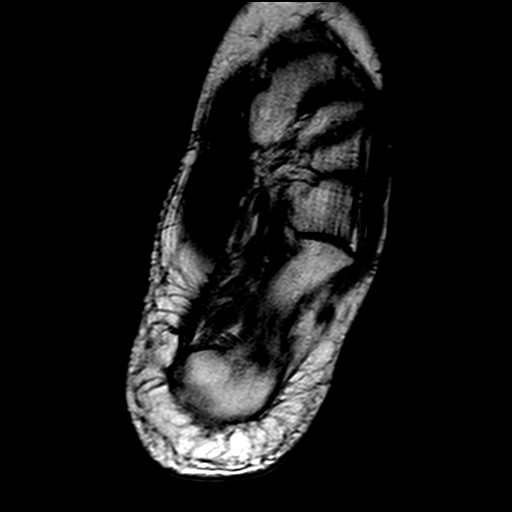
[im 8/28]
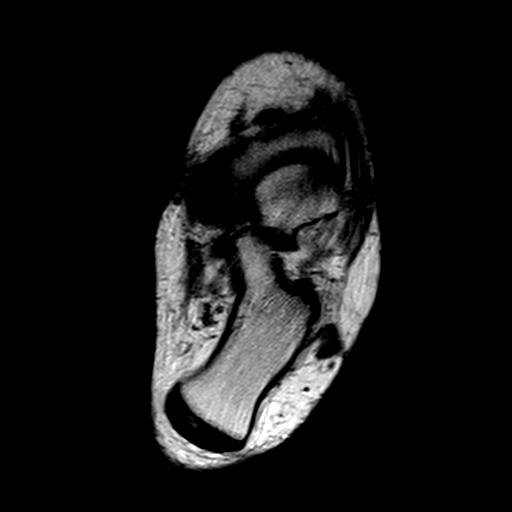
[im 12/28]
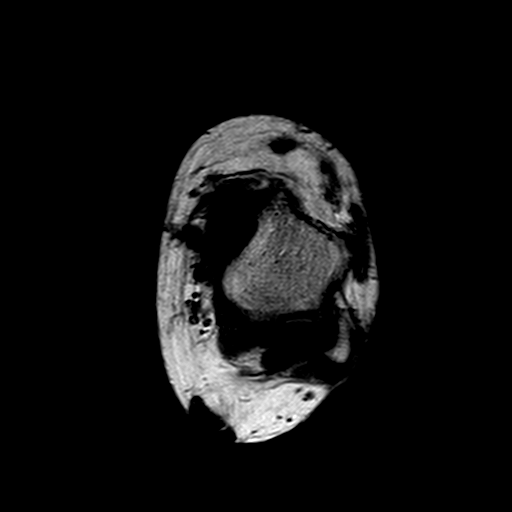
[im 16/28]
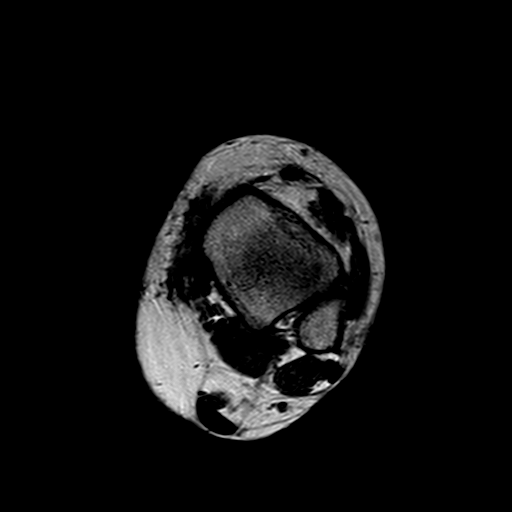
[im 24/28]
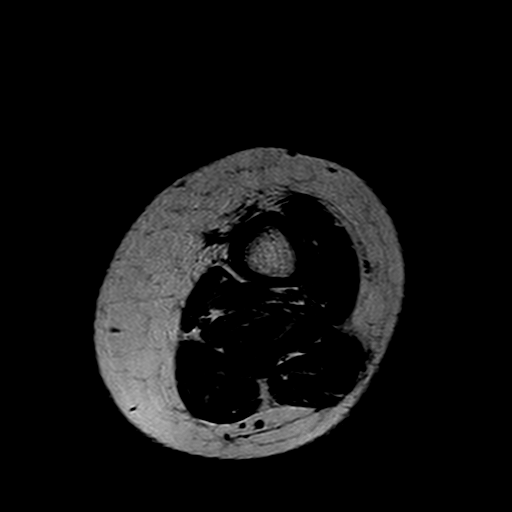

[Series 11: T2 fat-sat · axial · left · 4.5mm · 0.33mm/px · z∈[-36,+100]mm · 8 of 28 slices shown]
[im 1/28]
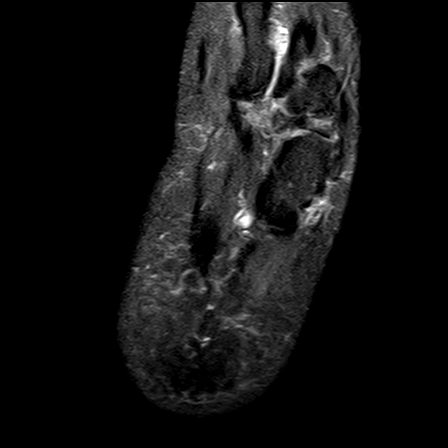
[im 4/28]
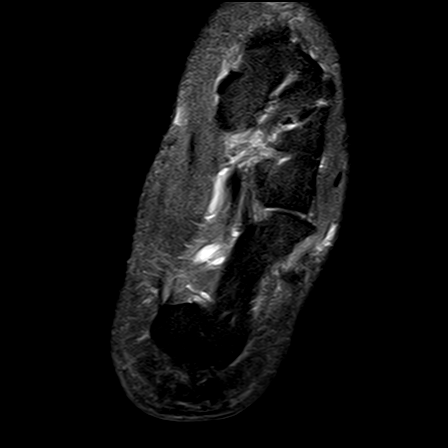
[im 8/28]
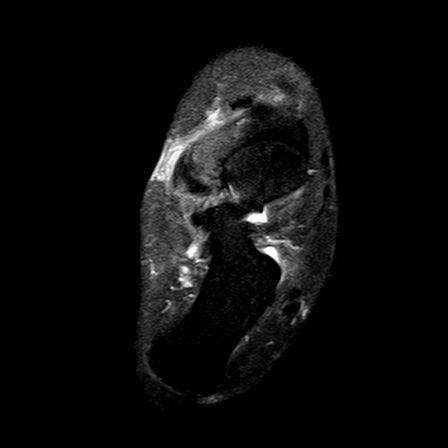
[im 12/28]
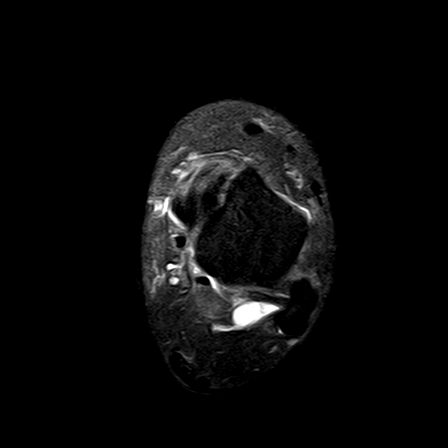
[im 16/28]
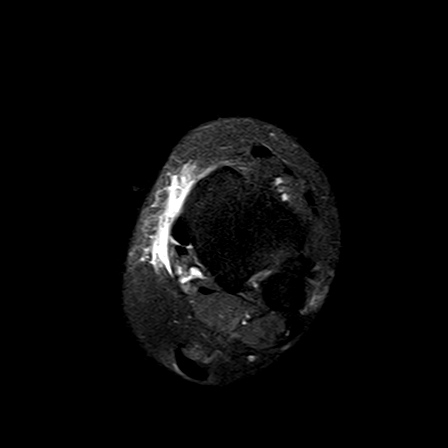
[im 20/28]
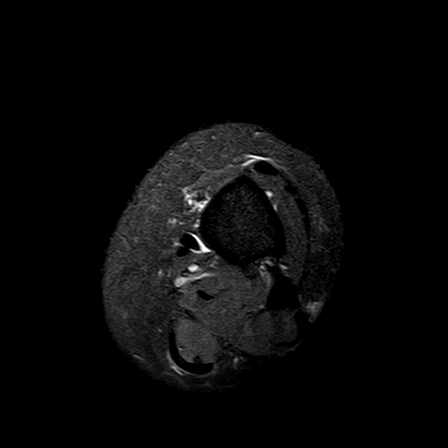
[im 24/28]
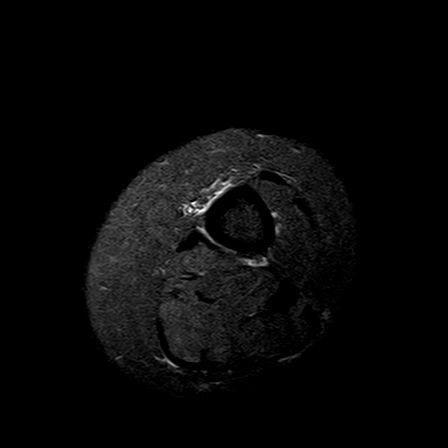
[im 28/28]
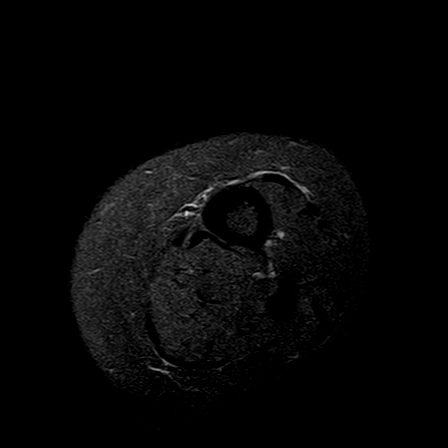

[Series 14: T1 · coronal · left · 4.0mm · 0.29mm/px · 3 of 26 slices shown (3 of 3)]
[im 5/26]
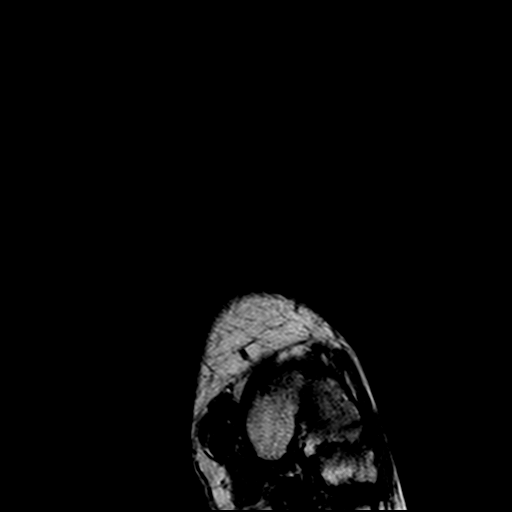
[im 13/26]
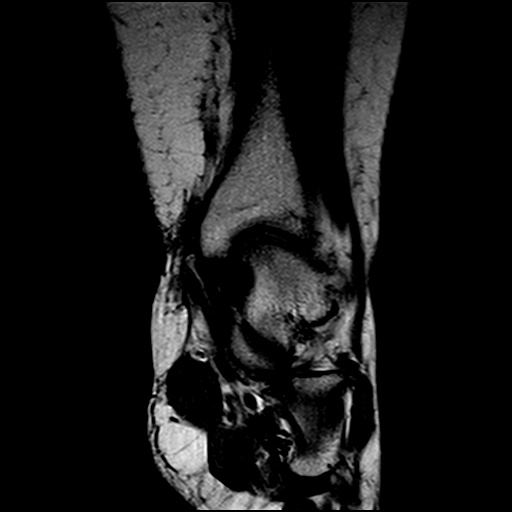
[im 21/26]
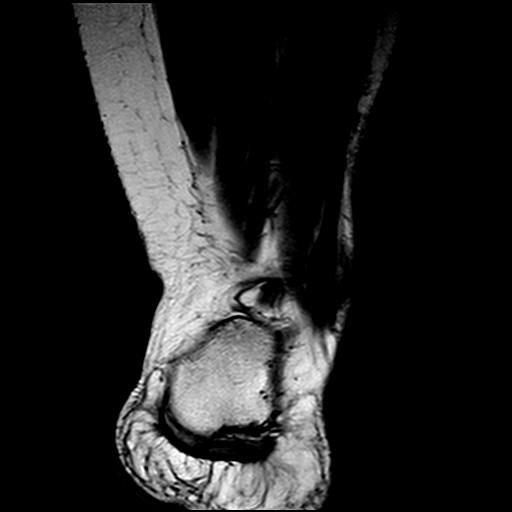

[22 of 40 positions shown; findings below may reference images not displayed]

FINDINGS: No acute fracture or bone bruise of distal tibia and fibula at the left ankle.  Subcutaneous edema and bruising over the medial malleolus and medial aspect of distal lower leg is noted.  No well-circumscribed hematoma is seen.

Bruising of the medial collateral ligament is noted.  No evidence of disruption of the medial collateral ligament is seen.  Medial tendons of the ankle are intact.

Achilles tendon and plantar fascia are intact.

Peroneal tendons are intact.  Anterior and posterior talofibular ligaments and calcaneofibular ligaments are intact.  There is no widening of the ankle mortise.

Examination of the foot shows focal bone bruise of the medial aspect of the navicular bone.  No fracture line is noted. Subcutaneous edema and bruising over the medial aspect of the midfoot over the navicular bone is noted.

No other fractures or bone bruises of the foot are seen.
IMPRESSION: 1. Acute bone bruise of the medial aspect of the navicular bone over the medial aspect of the left foot is noted with localized soft tissue bruising over this area.

2. Bruising of the medial collateral ligament. No evidence of disruption of the medial ligament of the ankle.

3. Tendons at the ankle are intact.

4. Quality of study is suboptimal due to motion artifacts on multiple sequences despite repeat sequences.

## 2022-09-07 IMAGING — MR MRI FOOT LT WO CONTRAST
4 of 6 series · 18 of 40 positions shown · IV contrast (gadolinium)
Comparison: No prior imaging studies of the ankle and foot are available for comparison.

﻿EXAM:  [DATE]   MRI ANKLE LT WO CONTAST,MRI FOOT LT WO CONTRAST
INDICATION: 41-year-old female sustained twisting injury recently.  Pain and swelling of the lateral aspect of the ankle and medial aspect of the foot.  No prior surgery.
TECHNIQUE: Multiplanar, multisequential MRI of the left ankle and left foot was performed without gadolinium contrast.  Overall visualization is limited by motion artifacts on multiple sequences. Some of the sequences were repeated.  Quality of the study acceptable for interpretation.

[Series 4: T1 · sagittal · left · 3.5mm · 0.53mm/px · 6 of 22 slices shown (1 of 2)]
[im 1/22]
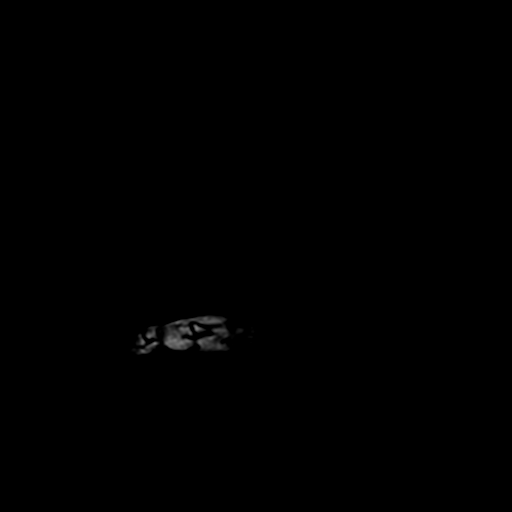
[im 5/22]
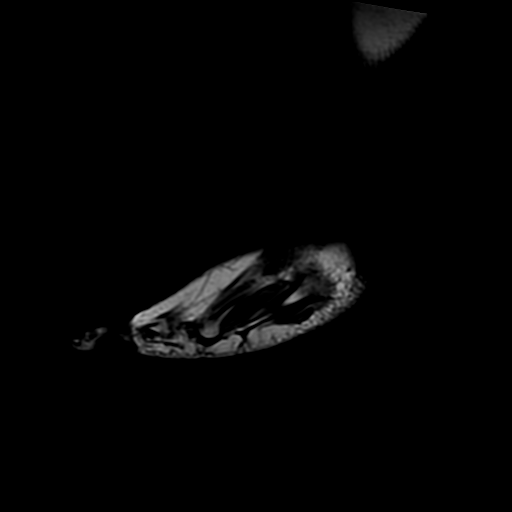
[im 9/22]
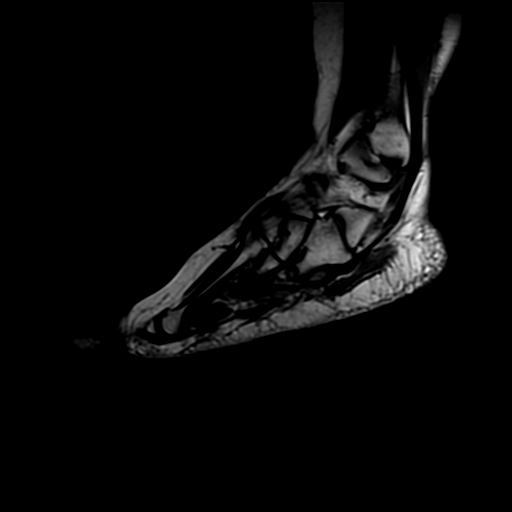
[im 13/22]
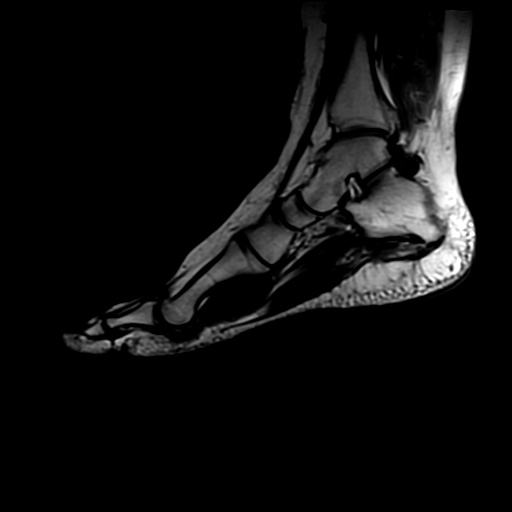
[im 17/22]
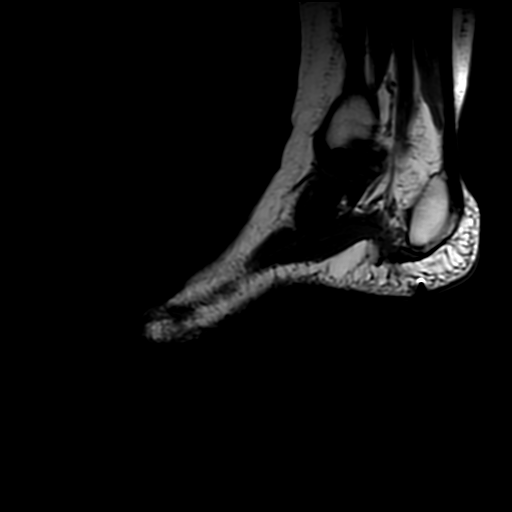
[im 22/22]
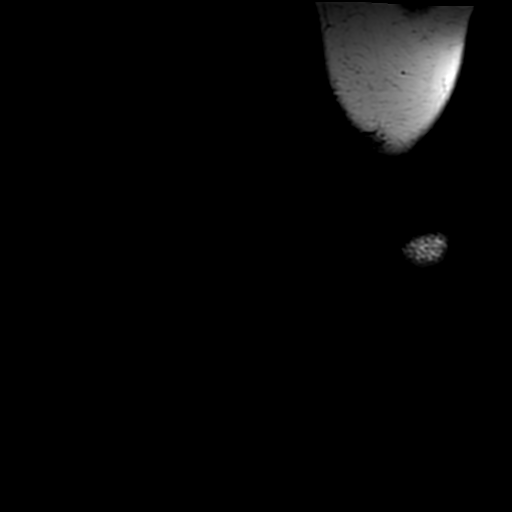

[Series 5: STIR · sagittal · left · 3.0mm · 0.49mm/px · 3 of 22 slices shown]
[im 5/22]
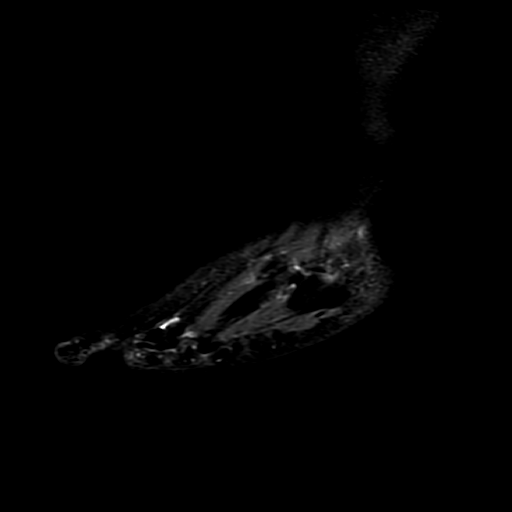
[im 13/22]
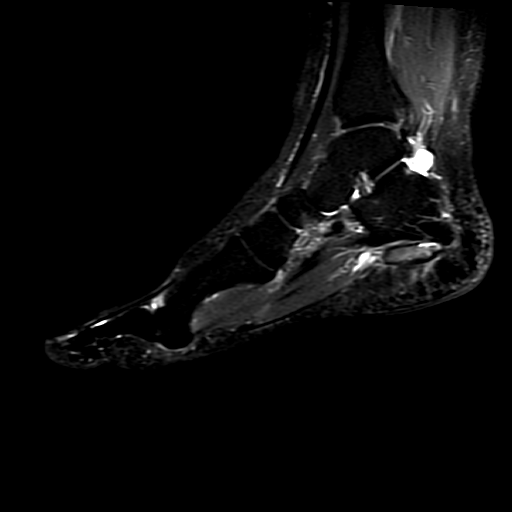
[im 22/22]
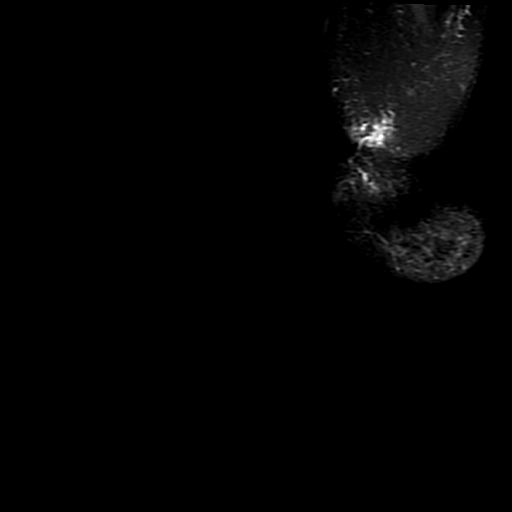

[Series 6: T1 · axial · left · 3.5mm · 0.49mm/px · z∈[-67,+5]mm · 3 of 22 slices shown (2 of 2)]
[im 5/22]
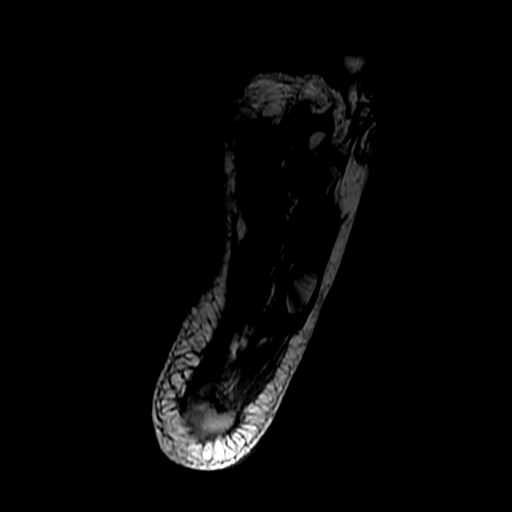
[im 13/22]
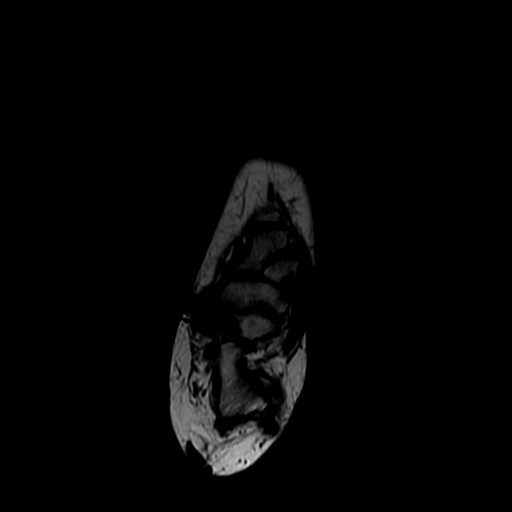
[im 22/22]
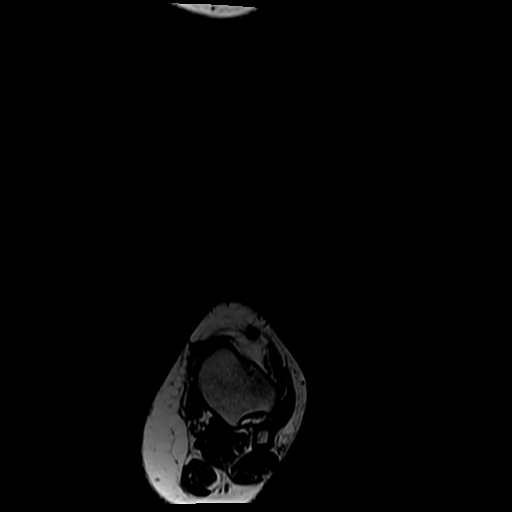

[Series 7: T2 fat-sat · axial · left · 3.5mm · 0.49mm/px · z∈[-84,+5]mm · 6 of 22 slices shown]
[im 1/22]
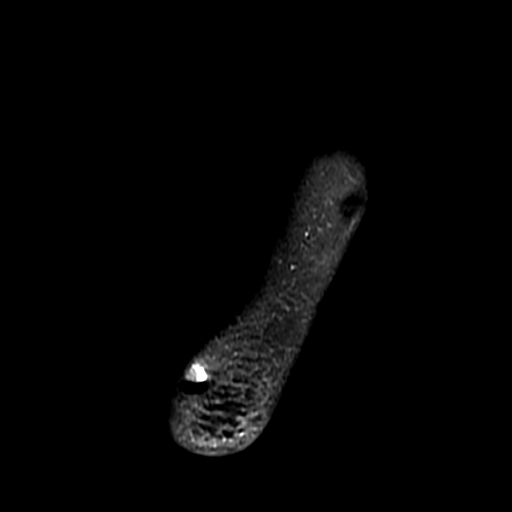
[im 5/22]
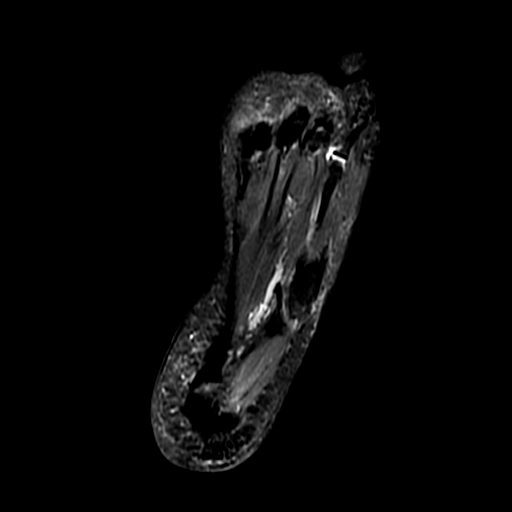
[im 9/22]
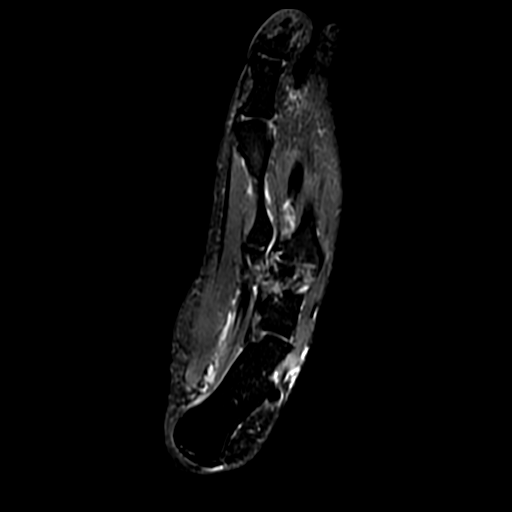
[im 13/22]
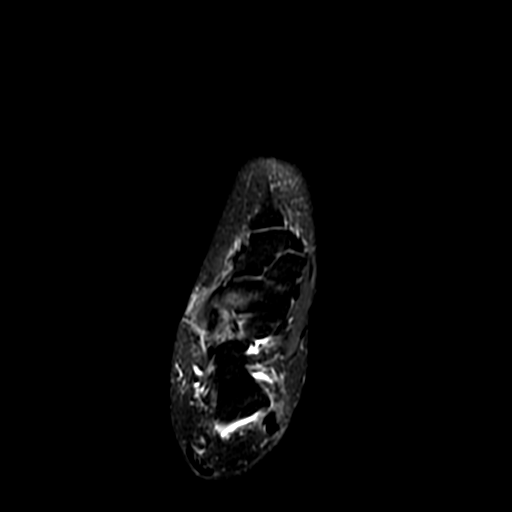
[im 17/22]
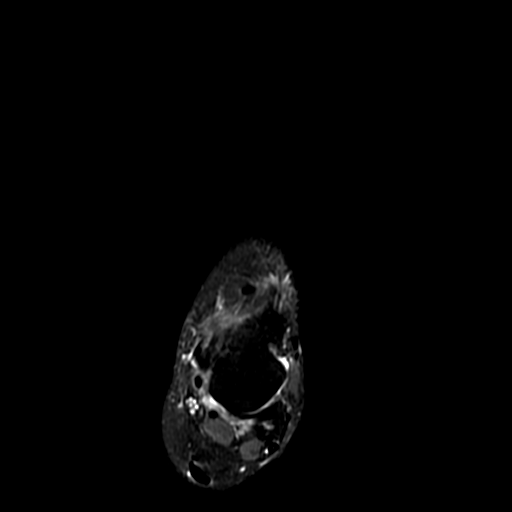
[im 22/22]
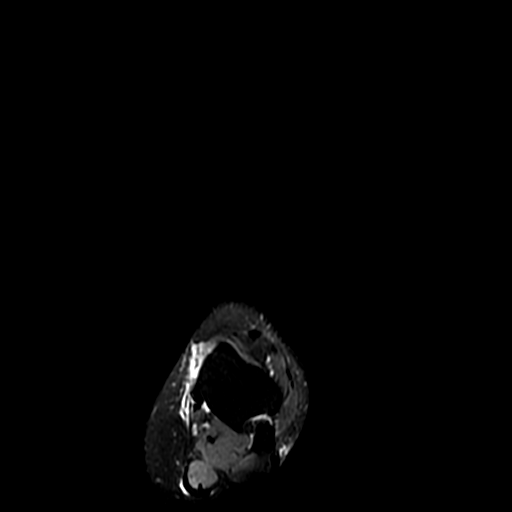

[18 of 40 positions shown; findings below may reference images not displayed]

FINDINGS: No acute fracture or bone bruise of distal tibia and fibula at the left ankle.  Subcutaneous edema and bruising over the medial malleolus and medial aspect of distal lower leg is noted.  No well-circumscribed hematoma is seen.

Bruising of the medial collateral ligament is noted.  No evidence of disruption of the medial collateral ligament is seen.  Medial tendons of the ankle are intact.

Achilles tendon and plantar fascia are intact.

Peroneal tendons are intact.  Anterior and posterior talofibular ligaments and calcaneofibular ligaments are intact.  There is no widening of the ankle mortise.

Examination of the foot shows focal bone bruise of the medial aspect of the navicular bone.  No fracture line is noted. Subcutaneous edema and bruising over the medial aspect of the midfoot over the navicular bone is noted.

No other fractures or bone bruises of the foot are seen.
IMPRESSION: 1. Acute bone bruise of the medial aspect of the navicular bone over the medial aspect of the left foot is noted with localized soft tissue bruising over this area.

2. Bruising of the medial collateral ligament. No evidence of disruption of the medial ligament of the ankle.

3. Tendons at the ankle are intact.

4. Quality of study is suboptimal due to motion artifacts on multiple sequences despite repeat sequences.

## 2022-09-10 DIAGNOSIS — R5383 Other fatigue: Secondary | ICD-10-CM

## 2022-09-10 DIAGNOSIS — R635 Abnormal weight gain: Secondary | ICD-10-CM

## 2022-09-10 LAB — ECG 12 LEAD
Atrial Rate: 73 {beats}/min
Calculated P Axis: 46 degrees
Calculated R Axis: 36 degrees
Calculated T Axis: 19 degrees
PR Interval: 150 ms
QRS Duration: 88 ms
QT Interval: 390 ms
QTC Calculation: 429 ms
Ventricular rate: 73 {beats}/min

## 2022-09-13 NOTE — Procedures (Signed)
NAMENALEAH, KOFOED  HOSPITAL NUMBER:  [id number]  DATE OF SERVICE:  09/06/2022  DOB:  Jul 02, 1981  SEX:  F      NAME OF STUDY:  Pulmonary function test.    INDICATION FOR STUDY:  Shortness of breath.    SPIROMETRY:  The patient's forced vital capacity is 95% of predicted, FEV1 is 97% of predicted, and the FEV1/FVC ratio is 84%.  The mandatory voluntary ventilation is in proportion to the FEV1.    LUNG VOLUMES:  The patient's vital capacity is 91% of predicted.  Residual volume is 104% of predicted.  Total lung capacity is 96% of predicted.    IMPRESSION:  1. Normal spirometry.  2. Normal vital capacity.  3. Normal residual volume.  4. Normal total lung capacity.        Renee Rival, MD [facility], [place]  Pulmonary Critical Care Sleep Medicine Consultant        DD:  09/12/2022 22:58:52  DT:  09/13/2022 06:54:41 KF  D#:  [id number]

## 2022-10-30 ENCOUNTER — Emergency Department (HOSPITAL_COMMUNITY): Payer: 59

## 2022-10-30 ENCOUNTER — Emergency Department
Admission: EM | Admit: 2022-10-30 | Discharge: 2022-10-30 | Disposition: A | Discharge status: Home / Self Care | Payer: 59 | Attending: Nurse Practitioner | Admitting: Nurse Practitioner

## 2022-10-30 ENCOUNTER — Encounter (HOSPITAL_COMMUNITY): Payer: Self-pay | Admitting: Nurse Practitioner

## 2022-10-30 ENCOUNTER — Other Ambulatory Visit: Payer: Self-pay

## 2022-10-30 DIAGNOSIS — M62830 Muscle spasm of back: Secondary | ICD-10-CM

## 2022-10-30 DIAGNOSIS — M47816 Spondylosis without myelopathy or radiculopathy, lumbar region: Secondary | ICD-10-CM | POA: Insufficient documentation

## 2022-10-30 DIAGNOSIS — M5441 Lumbago with sciatica, right side: Secondary | ICD-10-CM

## 2022-10-30 MED ORDER — METHOCARBAMOL 500 MG TABLET
1000.0000 mg | ORAL_TABLET | ORAL | Status: AC
Start: 2022-10-30 — End: 2022-10-30
  Administered 2022-10-30: 1000 mg via ORAL

## 2022-10-30 MED ORDER — MORPHINE 4 MG/ML INJECTION WRAPPER
INJECTION | INTRAMUSCULAR | Status: AC
Start: 2022-10-30 — End: 2022-10-30
  Filled 2022-10-30: qty 1

## 2022-10-30 MED ORDER — ONDANSETRON 4 MG DISINTEGRATING TABLET
ORAL_TABLET | ORAL | Status: AC
Start: 2022-10-30 — End: 2022-10-30
  Filled 2022-10-30: qty 1

## 2022-10-30 MED ORDER — ONDANSETRON 4 MG DISINTEGRATING TABLET
4.0000 mg | ORAL_TABLET | ORAL | Status: AC
Start: 2022-10-30 — End: 2022-10-30
  Administered 2022-10-30: 4 mg via ORAL

## 2022-10-30 MED ORDER — KETOROLAC 60 MG/2 ML INTRAMUSCULAR SOLUTION
INTRAMUSCULAR | Status: AC
Start: 2022-10-30 — End: 2022-10-30
  Filled 2022-10-30: qty 2

## 2022-10-30 MED ORDER — METHOCARBAMOL 500 MG TABLET
500.0000 mg | ORAL_TABLET | Freq: Four times a day (QID) | ORAL | 0 refills | Status: AC
Start: 2022-10-30 — End: 2022-11-04

## 2022-10-30 MED ORDER — METHOCARBAMOL 500 MG TABLET
ORAL_TABLET | ORAL | Status: AC
Start: 2022-10-30 — End: 2022-10-30
  Filled 2022-10-30: qty 2

## 2022-10-30 MED ORDER — MORPHINE 4 MG/ML INJECTION WRAPPER
4.0000 mg | INJECTION | INTRAMUSCULAR | Status: AC
Start: 2022-10-30 — End: 2022-10-30
  Administered 2022-10-30: 4 mg via INTRAMUSCULAR

## 2022-10-30 MED ORDER — PREDNISONE 20 MG TABLET
20.0000 mg | ORAL_TABLET | Freq: Three times a day (TID) | ORAL | 0 refills | Status: AC
Start: 2022-10-30 — End: 2022-11-04

## 2022-10-30 MED ORDER — KETOROLAC 60 MG/2 ML INTRAMUSCULAR SOLUTION
60.0000 mg | INTRAMUSCULAR | Status: AC
Start: 2022-10-30 — End: 2022-10-30
  Administered 2022-10-30: 60 mg via INTRAMUSCULAR

## 2022-10-30 NOTE — ED Provider Notes (Signed)
[facility]  ED Primary Provider Note  History of Present Illness   Chief Complaint   Patient presents with    Back Pain     Taylor Beasley is a 42 y.o. female who had concerns including Back Pain.  Arrival: The patient arrived by Car      This 42 year old female presents to the emergency department complaining of right lower back pain that radiates down her right leg that started at 5:00 a.m. this morning.  She reports that she has had history of back pain in the past but it has not been this bad.  She describes her pain as sharp and rates the pain an 8/10.  She denies loss of bowel or bladder function.  She denies numbness.  She denies fever.  She denies injury.      History provided by:  Patient    History Reviewed This Encounter: Medical History  Surgical History  Family History  Social History    Physical Exam   ED Triage Vitals [10/30/22 0721]   BP (Non-Invasive) (!) 147/89   Heart Rate 68   Respiratory Rate 20   Temperature 36.8 C (98.2 F)   SpO2 97 %   Weight (!) 151 kg (332 lb)   Height 1.676 m (5\' 6" )     Physical Exam  Vitals and nursing note reviewed.   Constitutional:       General: She is not in acute distress.     Appearance: Normal appearance. She is obese. She is not ill-appearing, toxic-appearing or diaphoretic.   HENT:      Head: Normocephalic and atraumatic.      Right Ear: External ear normal.      Left Ear: External ear normal.      Nose: Nose normal.      Mouth/Throat:      Mouth: Mucous membranes are moist.      Pharynx: Oropharynx is clear.   Eyes:      Conjunctiva/sclera: Conjunctivae normal.      Pupils: Pupils are equal, round, and reactive to light.   Cardiovascular:      Rate and Rhythm: Normal rate and regular rhythm.      Pulses: Normal pulses.      Heart sounds: Normal heart sounds.   Pulmonary:      Effort: Pulmonary effort is normal. No respiratory distress.      Breath sounds: Normal breath sounds. No stridor. No wheezing, rhonchi or rales.    Chest:      Chest wall: No tenderness.   Abdominal:      General: Abdomen is flat. Bowel sounds are normal. There is no distension.      Palpations: Abdomen is soft. There is no mass.      Tenderness: There is no abdominal tenderness.   Musculoskeletal:         General: Tenderness (Right lateral lumbar region over a paraspinal muscle spasm.) present. No deformity.      Cervical back: Normal range of motion. No rigidity or tenderness.      Comments: Painful range of motion of the lumbar spine   Skin:     General: Skin is warm and dry.      Capillary Refill: Capillary refill takes less than 2 seconds.      Coloration: Skin is not jaundiced or pale.      Findings: No bruising, erythema or rash.   Neurological:      General: No focal deficit present.  Mental Status: She is alert and oriented to person, place, and time.      Cranial Nerves: No cranial nerve deficit.      Sensory: No sensory deficit.      Motor: No weakness.      Gait: Gait normal.   Psychiatric:         Mood and Affect: Mood normal.         Behavior: Behavior normal.       Patient Data     Labs Ordered/Reviewed - No data to display  XR LUMBAR SPINE AP AND LAT   Final Result by Edi, Radresults In (02/03 0805)   MILD DEGENERATIVE CHANGES OF THE LUMBAR SPINE.               Radiologist location ID: [facility]          Medical Decision Making  This 42 year old female presented to the emergency department complaining of right lower back pain that radiates down her right leg that started this morning at 5:00 a.m.[place] Kitchen  She denies loss of bowel or bladder function.  She denies numbness.  Physical exam of the lumbar spine reveals a paraspinal muscle spasm in the right lower back which was tender to palpation.  Lumbar x-ray showed degenerative changes with no acute abnormality.  The patient was treated here in the emergency department with Toradol 60 mg IM, Robaxin 1000 mg p.o., and morphine 4 mg IM as well as Zofran 4 mg ODT.   She will be discharged and treated on outpatient basis with prednisone 20 mg 3 times a day for 5 days.  She was also prescribed Robaxin 500 mg every 6 hours as needed for back muscle spasms.  She was instructed to follow-up with her primary care provider 1-3 days and/or return to the emergency department if worse or as needed.    Problems Addressed:  Acute right-sided low back pain with right-sided sciatica: acute illness or injury  Spasm of muscle of lower back: acute illness or injury    Amount and/or Complexity of Data Reviewed  Radiology: ordered and independent interpretation performed. Decision-making details documented in ED Course.     Details: X-ray lumbar spine AP and lateral shows mild degenerative changes of the lumbar spine.    Risk  Prescription drug management.  Parenteral controlled substances.      ED Course as of 10/30/22 1000   Sat Oct 30, 2022   0834 X-ray of the lumbar spine AP and lateral shows mild degenerative changes of the lumbar spine.   0852 Alert x3 and in no acute distress.  The diagnosis and treatment plan was explained to the patient verbalized understanding of the discharge instructions.         Medications Administered in the ED   ketorolac (TORADOL) 60mg /2 mL IM injection (60 mg IntraMUSCULAR Given 10/30/22 0801)   methocarbamol (ROBAXIN) tablet (1,000 mg Oral Given 10/30/22 0801)   morphine 4 mg/mL injection (4 mg IntraMUSCULAR Given 10/30/22 0902)   ondansetron (ZOFRAN ODT) rapid dissolve tablet (4 mg Oral Given 10/30/22 0903)     Clinical Impression   Acute right-sided low back pain with right-sided sciatica (Primary)   Spasm of muscle of lower back       Disposition: Discharged

## 2022-10-30 NOTE — ED Nurses Note (Signed)
Patient discharged home with family.  AVS reviewed with patient/care giver.  A written copy of the AVS and discharge instructions was given to the patient/care giver.  Questions sufficiently answered as needed.  Patient/care giver encouraged to follow up with PCP as indicated.  In the event of an emergency, patient/care giver instructed to call 911 or go to the nearest emergency room.

## 2022-10-30 NOTE — ED Nurses Note (Signed)
Pt states she hurt her left ankle in September of last year and then her right knee started hurting until it went to her lumbar spine and is not radiating from her lumbar spine down her right leg. It is constantly dull and intermittently sharp. Sitting and moving hurts worse than standing still.

## 2022-10-30 NOTE — ED Triage Notes (Signed)
Lower back pain down rt leg started this am awaken with pain .

## 2022-10-30 NOTE — Discharge Instructions (Signed)
Drink plenty of water.  Take Robaxin 500 mg every 6 hours as needed for back muscle spasms.  Take prednisone 20 mg every 8 hours for 5 days.  Follow-up with your primary care provider in 2-3 days and/or return to the emergency department if worse or as needed.  We hope you feel better.

## 2022-11-01 ENCOUNTER — Other Ambulatory Visit (HOSPITAL_COMMUNITY): Payer: Self-pay | Admitting: Family

## 2022-11-01 DIAGNOSIS — Z1231 Encounter for screening mammogram for malignant neoplasm of breast: Secondary | ICD-10-CM

## 2022-11-15 ENCOUNTER — Encounter (HOSPITAL_COMMUNITY): Payer: Self-pay

## 2022-11-15 ENCOUNTER — Inpatient Hospital Stay
Admission: RE | Admit: 2022-11-15 | Discharge: 2022-11-15 | Disposition: A | Discharge status: Home / Self Care | Payer: 59 | Source: Ambulatory Visit | Attending: Family | Admitting: Family

## 2022-11-15 ENCOUNTER — Other Ambulatory Visit: Payer: Self-pay

## 2022-11-15 DIAGNOSIS — Z1231 Encounter for screening mammogram for malignant neoplasm of breast: Secondary | ICD-10-CM | POA: Insufficient documentation

## 2022-11-25 ENCOUNTER — Other Ambulatory Visit (INDEPENDENT_AMBULATORY_CARE_PROVIDER_SITE_OTHER): Payer: Self-pay | Admitting: Family

## 2022-11-25 DIAGNOSIS — E119 Type 2 diabetes mellitus without complications: Secondary | ICD-10-CM

## 2022-12-06 IMAGING — MR MRI LUMBAR SPINE WITHOUT CONTRAST
6 series · 48 of 48 positions shown · IV contrast (gadolinium)
Comparison: None available.

﻿EXAM:  [id number]   MRI LUMBAR SPINE WITHOUT CONTRAST
INDICATION: 42-year-old with persistent low back pain radiating to the right leg.  No history of trauma or back surgery.
TECHNIQUE: Multiplanar, multisequential MRI of the lumbosacral spine was performed without gadolinium contrast.  Overall quality is less than optimal due to the large body habitus.  Overall quality is acceptable for interpretation.

[Series 4: T2 · sagittal · 5.0mm · 1.00mm/px · 5 of 13 slices shown (1 of 3)]
[im 1/13]
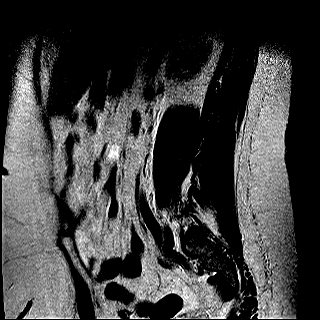
[im 4/13]
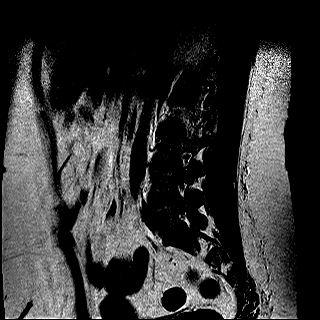
[im 7/13]
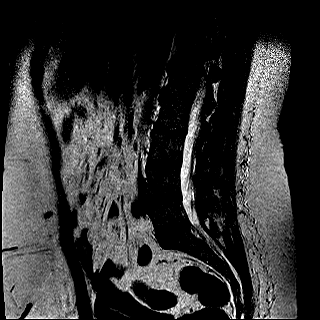
[im 10/13]
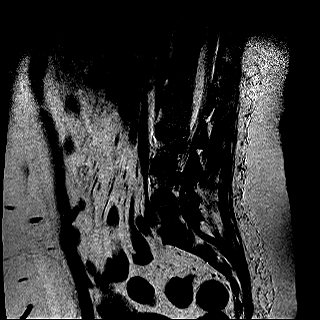
[im 13/13]
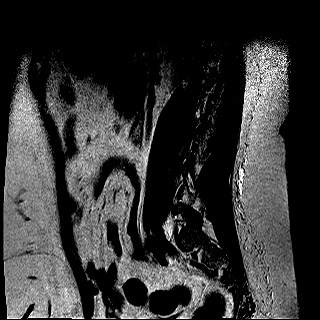

[Series 5: T1 · sagittal · 5.0mm · 1.00mm/px · 6 of 13 slices shown (1 of 2)]
[im 1/13]
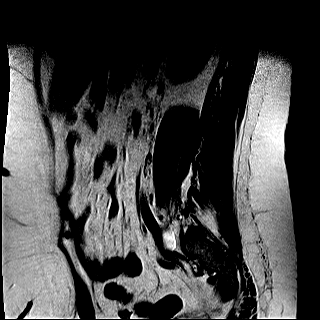
[im 3/13]
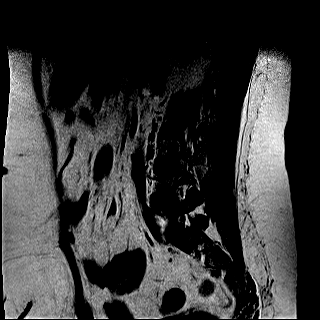
[im 5/13]
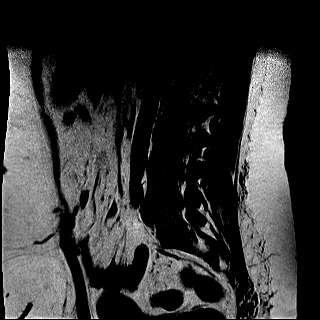
[im 8/13]
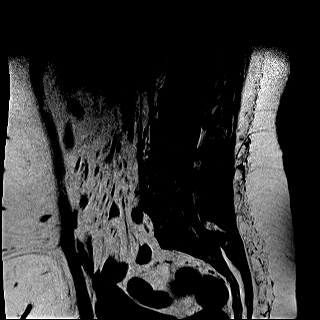
[im 10/13]
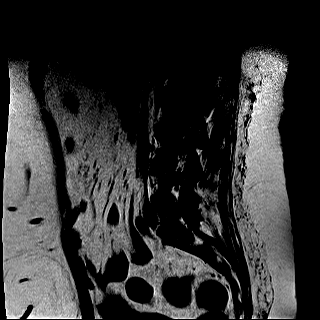
[im 13/13]
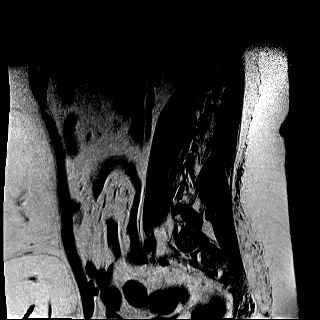

[Series 6: STIR · sagittal · 5.0mm · 1.25mm/px · 6 of 13 slices shown]
[im 1/13]
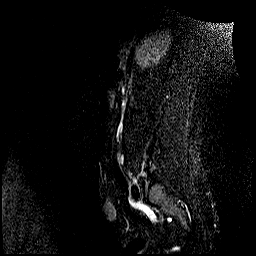
[im 3/13]
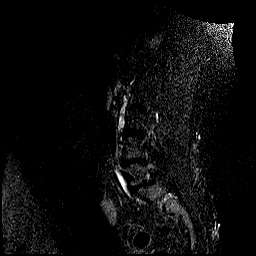
[im 5/13]
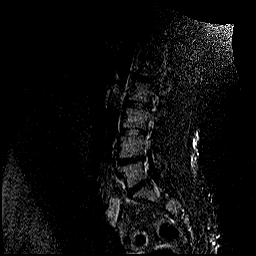
[im 8/13]
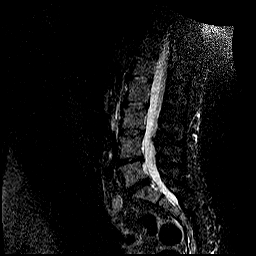
[im 10/13]
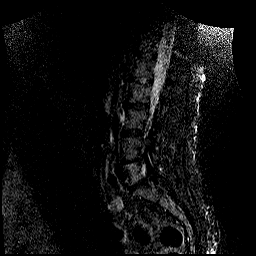
[im 13/13]
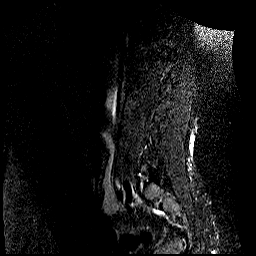

[Series 7: T2 · axial · 5.0mm · 0.89mm/px · z∈[-40,+152]mm · 11 of 25 slices shown (2 of 3)]
[im 1/25]
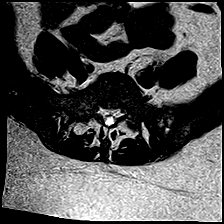
[im 3/25]
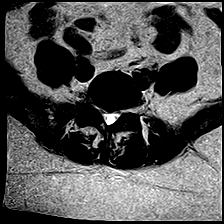
[im 5/25]
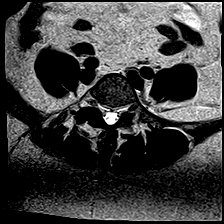
[im 8/25]
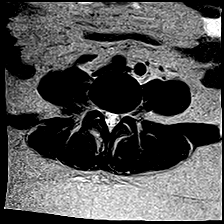
[im 10/25]
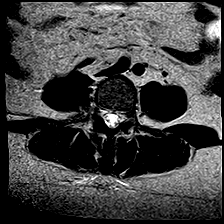
[im 13/25]
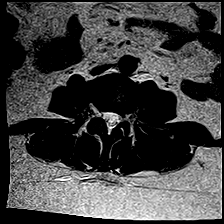
[im 15/25]
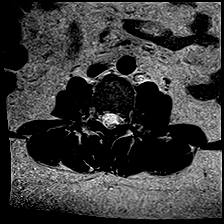
[im 17/25]
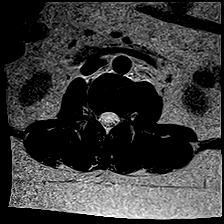
[im 20/25]
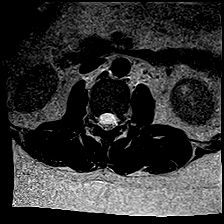
[im 22/25]
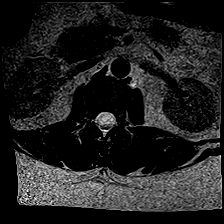
[im 25/25]
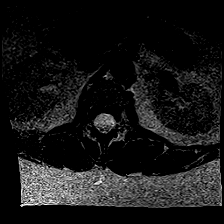

[Series 8: T1 · axial · 5.0mm · 0.89mm/px · z∈[-40,+152]mm · 11 of 25 slices shown (2 of 2)]
[im 1/25]
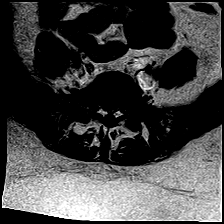
[im 3/25]
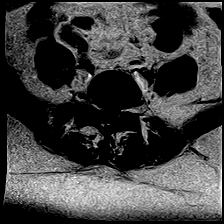
[im 5/25]
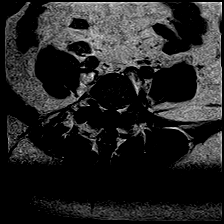
[im 8/25]
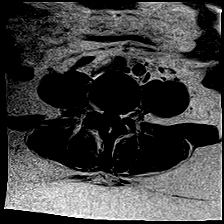
[im 10/25]
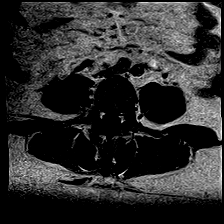
[im 13/25]
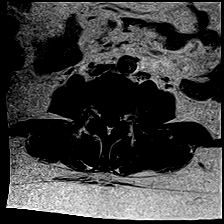
[im 15/25]
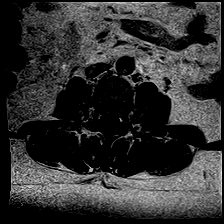
[im 17/25]
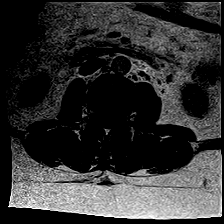
[im 20/25]
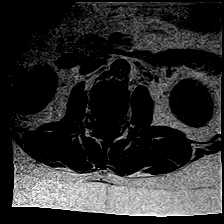
[im 22/25]
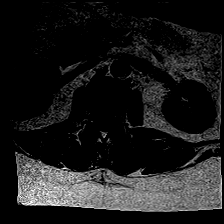
[im 25/25]
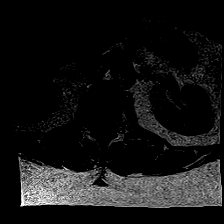

[Series 9: T2 · coronal · 4.0mm · 1.61mm/px · 9 of 20 slices shown (3 of 3)]
[im 1/20]
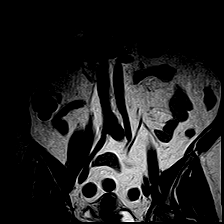
[im 3/20]
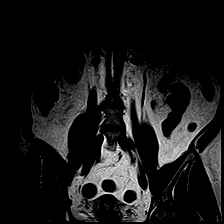
[im 5/20]
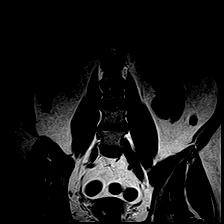
[im 8/20]
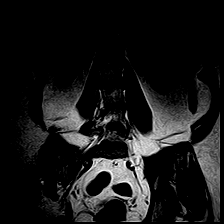
[im 10/20]
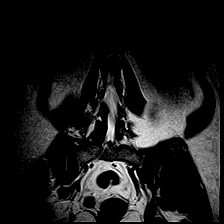
[im 12/20]
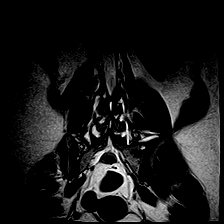
[im 15/20]
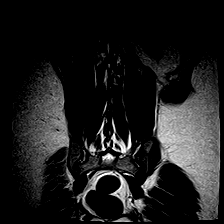
[im 17/20]
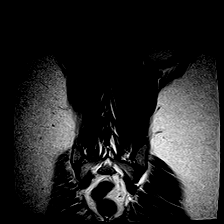
[im 20/20]
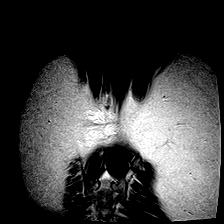

[48 of 48 positions shown; findings below may reference images not displayed]

FINDINGS: No acute bony lesions are seen.

At L1-2 disc level, no focal disc lesions are seen.

At L2-3 level, no focal disc lesions are seen. 

At L3-4 level, bilateral facet arthropathy is causing mild compromise of both lateral recesses. 

At L4-5 level, significant bilateral facet arthropathy is noted along with degenerative disc disease with asymmetric bulging annulus to the left.  The disc changes and facet arthropathy are causing moderate biforaminal narrowing at this level.  No evidence of spinal stenosis. 

At L5-S1 level, significant degenerative disc disease is noted with broad right paracentral disc protrusion mildly impinging on the thecal sac to the right of the midline.  Bulging annulus along with the disc protrusion and facet arthropathy are causing moderate to significant compromise of right lateral recess and right neural foramen at this level. Mild compromise of left neural foramen is noted.

Paravertebral soft tissues are unremarkable.
IMPRESSION: 1. Overall quality is less than optimal due to the body habitus.

2. No acute bony lesions of lumbar vertebrae. 

3. At L4-5 level, significant bilateral facet arthropathy is noted along with degenerative disc disease with asymmetric bulging annulus to the left.  The disc changes and facet arthropathy are causing moderate biforaminal narrowing at this level.  No evidence of spinal stenosis. 

4.At L5-S1 level, significant degenerative disc disease is noted with broad right paracentral disc protrusion mildly impinging on the thecal sac to the right of the midline.  Bulging annulus along with the disc protrusion and facet arthropathy are causing moderate to significant compromise of right lateral recess and right neural foramen at this level. Mild compromise of left neural foramen is noted.

5. Findings at other disc levels are described above in detail.

## 2022-12-31 ENCOUNTER — Other Ambulatory Visit: Payer: Self-pay

## 2022-12-31 ENCOUNTER — Other Ambulatory Visit (HOSPITAL_COMMUNITY): Payer: Self-pay | Admitting: Surgery

## 2022-12-31 ENCOUNTER — Other Ambulatory Visit: Payer: 59 | Attending: Family

## 2022-12-31 ENCOUNTER — Inpatient Hospital Stay (HOSPITAL_BASED_OUTPATIENT_CLINIC_OR_DEPARTMENT_OTHER)
Admission: RE | Admit: 2022-12-31 | Discharge: 2022-12-31 | Disposition: A | Discharge status: Home / Self Care | Payer: 59 | Source: Ambulatory Visit | Attending: Family | Admitting: Family

## 2022-12-31 DIAGNOSIS — E119 Type 2 diabetes mellitus without complications: Secondary | ICD-10-CM | POA: Insufficient documentation

## 2022-12-31 DIAGNOSIS — Z01818 Encounter for other preprocedural examination: Secondary | ICD-10-CM | POA: Insufficient documentation

## 2022-12-31 LAB — COMPREHENSIVE METABOLIC PANEL, NON-FASTING
ALBUMIN/GLOBULIN RATIO: 1.1 (ref 0.8–1.4)
ALBUMIN: 4 g/dL (ref 3.5–5.7)
ALKALINE PHOSPHATASE: 48 U/L (ref 34–104)
ALT (SGPT): 53 U/L — ABNORMAL HIGH (ref 7–52)
ANION GAP: 11 mmol/L (ref 4–13)
AST (SGOT): 47 U/L — ABNORMAL HIGH (ref 13–39)
BILIRUBIN TOTAL: 0.7 mg/dL (ref 0.3–1.2)
BUN/CREA RATIO: 17 (ref 6–22)
BUN: 13 mg/dL (ref 7–25)
CALCIUM, CORRECTED: 9.1 mg/dL (ref 8.9–10.8)
CALCIUM: 9.1 mg/dL (ref 8.6–10.3)
CHLORIDE: 108 mmol/L — ABNORMAL HIGH (ref 98–107)
CO2 TOTAL: 21 mmol/L (ref 21–31)
CREATININE: 0.75 mg/dL (ref 0.60–1.30)
ESTIMATED GFR: 101 mL/min/{1.73_m2} (ref >59)
GLOBULIN: 3.7 (ref 2.9–5.4)
GLUCOSE: 136 mg/dL — ABNORMAL HIGH (ref 74–109)
OSMOLALITY, CALCULATED: 282 mOsm/kg (ref 270–290)
POTASSIUM: 3.7 mmol/L (ref 3.5–5.1)
PROTEIN TOTAL: 7.7 g/dL (ref 6.4–8.9)
SODIUM: 140 mmol/L (ref 136–145)

## 2022-12-31 LAB — CBC WITH DIFF
BASOPHIL #: 0.1 10*3/uL (ref 0.00–0.10)
BASOPHIL %: 1 % (ref 0–1)
EOSINOPHIL #: 0.2 10*3/uL (ref 0.00–0.50)
EOSINOPHIL %: 2 %
HCT: 47.1 % — ABNORMAL HIGH (ref 31.2–41.9)
HGB: 16 g/dL — ABNORMAL HIGH (ref 10.9–14.3)
LYMPHOCYTE #: 2.5 10*3/uL (ref 1.00–3.00)
LYMPHOCYTE %: 30 % (ref 16–44)
MCH: 30.4 pg (ref 24.7–32.8)
MCHC: 34 g/dL (ref 32.3–35.6)
MCV: 89.4 fL (ref 75.5–95.3)
MONOCYTE #: 0.7 10*3/uL (ref 0.30–1.00)
MONOCYTE %: 8 % (ref 5–13)
MPV: 7.8 fL — ABNORMAL LOW (ref 7.9–10.8)
NEUTROPHIL #: 5 10*3/uL (ref 1.85–7.80)
NEUTROPHIL %: 59 % (ref 43–77)
PLATELETS: 321 10*3/uL (ref 140–440)
RBC: 5.26 10*6/uL — ABNORMAL HIGH (ref 3.63–4.92)
RDW: 14.5 % (ref 12.3–17.7)
WBC: 8.5 10*3/uL (ref 3.8–11.8)

## 2023-01-03 DIAGNOSIS — E119 Type 2 diabetes mellitus without complications: Secondary | ICD-10-CM

## 2023-01-03 LAB — ECG 12 LEAD
Atrial Rate: 71 {beats}/min
Calculated P Axis: 25 degrees
Calculated R Axis: 27 degrees
Calculated T Axis: 15 degrees
PR Interval: 154 ms
QRS Duration: 78 ms
QT Interval: 414 ms
QTC Calculation: 449 ms
Ventricular rate: 71 {beats}/min

## 2023-03-08 ENCOUNTER — Ambulatory Visit (INDEPENDENT_AMBULATORY_CARE_PROVIDER_SITE_OTHER): Payer: Self-pay | Admitting: Neurological Surgery

## 2023-03-15 ENCOUNTER — Ambulatory Visit (INDEPENDENT_AMBULATORY_CARE_PROVIDER_SITE_OTHER): Payer: Self-pay | Admitting: PHYSICIAN ASSISTANT

## 2023-03-17 ENCOUNTER — Ambulatory Visit (INDEPENDENT_AMBULATORY_CARE_PROVIDER_SITE_OTHER): Payer: Self-pay | Admitting: PHYSICIAN ASSISTANT

## 2023-03-23 ENCOUNTER — Ambulatory Visit (INDEPENDENT_AMBULATORY_CARE_PROVIDER_SITE_OTHER): Payer: 59 | Admitting: PHYSICIAN ASSISTANT

## 2023-07-20 IMAGING — MR MRI LUMBAR SPINE WITHOUT CONTRAST
9 series · 48 of 48 positions shown · IV contrast (gadolinium)
Comparison: Previous MRI dated 12/06/2022.

﻿EXAM:  [id number]   MRI LUMBAR SPINE WITHOUT CONTRAST
INDICATION: 42-year-old with persistent chronic low back pain.  Radicular symptoms to right lower extremity. No history of back surgery or malignancy.
TECHNIQUE: Multiplanar, multisequential MRI of the lumbosacral spine was performed without gadolinium contrast.

[Series 4: sca (id) · sagittal · 10.0mm · 1.76mm/px · 3 of 5 slices shown (1 of 3)]
[im 1/5]
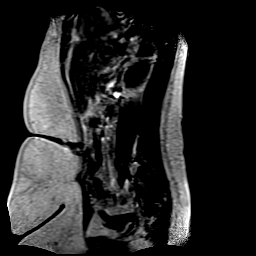
[im 3/5]
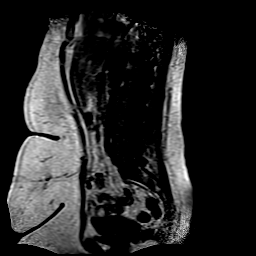
[im 5/5]
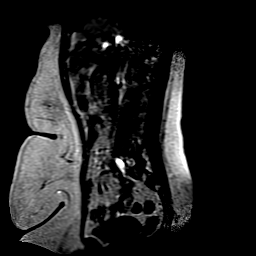

[Series 5: sca (id) · coronal · 10.0mm · 1.76mm/px · 2 of 5 slices shown (2 of 3)]
[im 1/5]
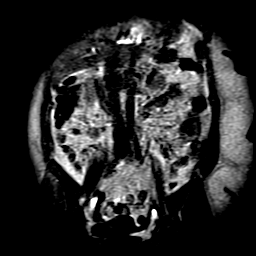
[im 5/5]
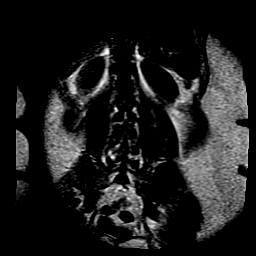

[Series 6: sca (id) · axial · 10.0mm · 1.76mm/px · z∈[-14,+46]mm · 2 of 5 slices shown (3 of 3)]
[im 1/5]
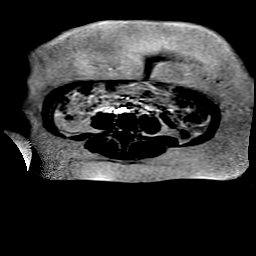
[im 5/5]
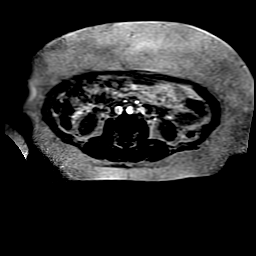

[Series 7: T2 · sagittal · 5.0mm · 1.09mm/px · 5 of 13 slices shown (1 of 3)]
[im 1/13]
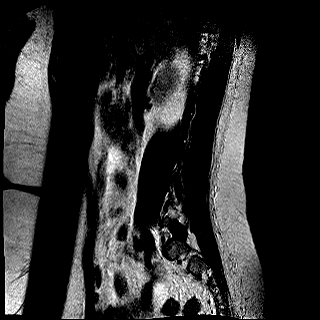
[im 4/13]
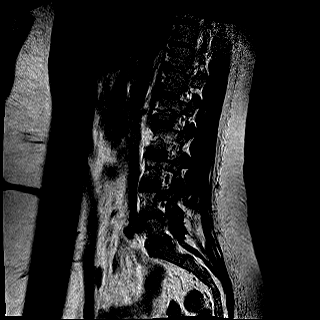
[im 7/13]
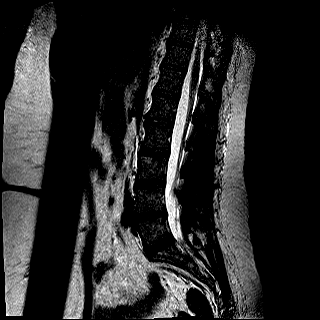
[im 10/13]
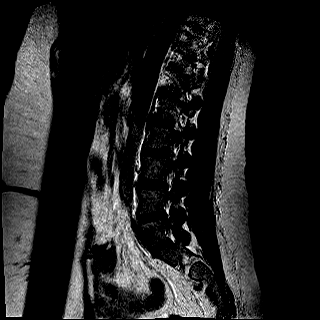
[im 13/13]
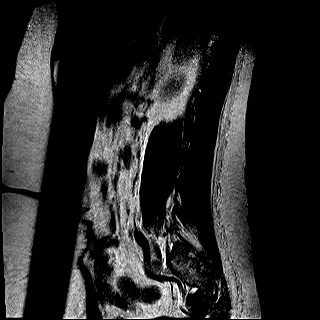

[Series 8: T1 · sagittal · 5.0mm · 1.09mm/px · 5 of 13 slices shown (1 of 2)]
[im 1/13]
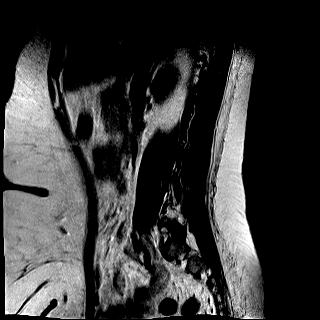
[im 4/13]
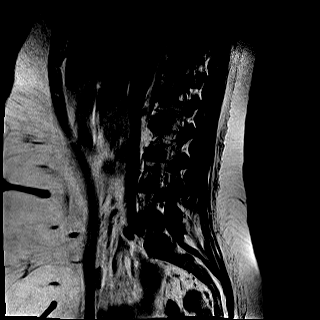
[im 7/13]
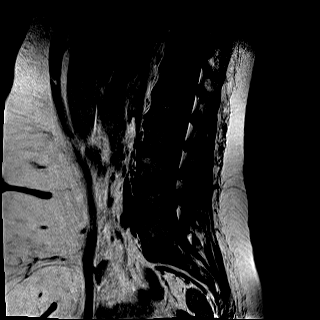
[im 10/13]
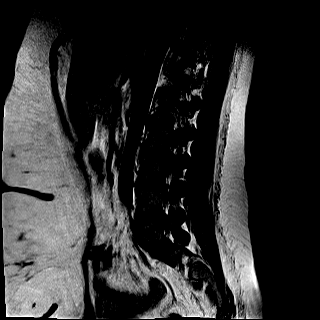
[im 13/13]
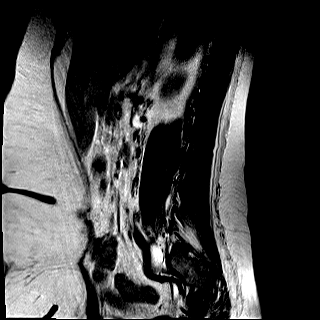

[Series 9: STIR · sagittal · 5.0mm · 1.37mm/px · 5 of 13 slices shown]
[im 1/13]
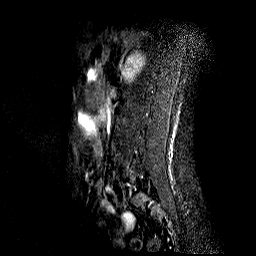
[im 4/13]
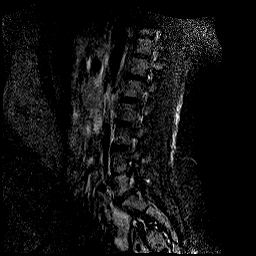
[im 7/13]
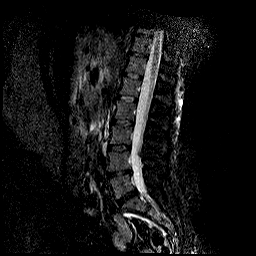
[im 10/13]
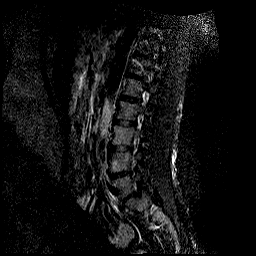
[im 13/13]
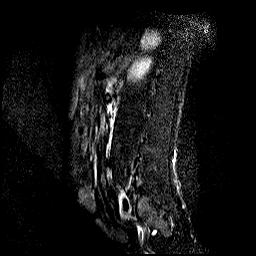

[Series 10: T2 · coronal · 4.0mm · 1.79mm/px · 8 of 20 slices shown (2 of 3)]
[im 1/20]
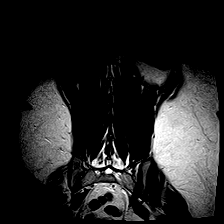
[im 3/20]
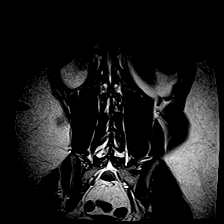
[im 6/20]
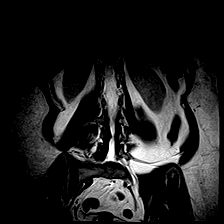
[im 9/20]
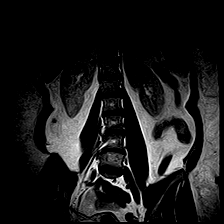
[im 11/20]
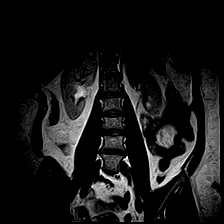
[im 14/20]
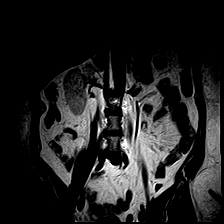
[im 17/20]
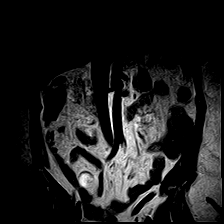
[im 20/20]
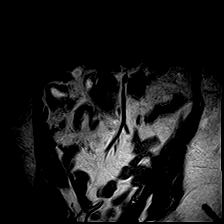

[Series 11: T2 · axial · 5.0mm · 0.89mm/px · z∈[-111,+97]mm · 9 of 24 slices shown (3 of 3)]
[im 1/24]
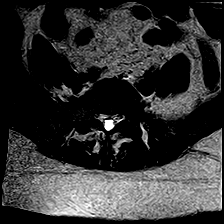
[im 3/24]
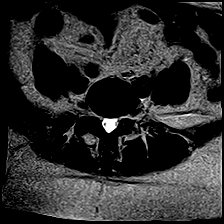
[im 6/24]
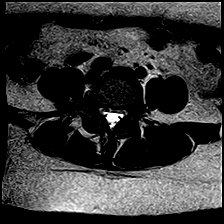
[im 9/24]
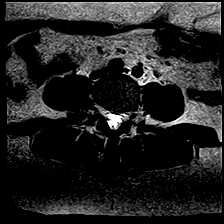
[im 12/24]
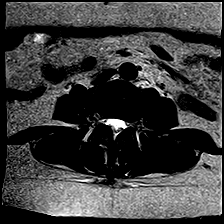
[im 15/24]
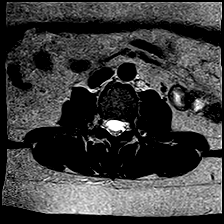
[im 18/24]
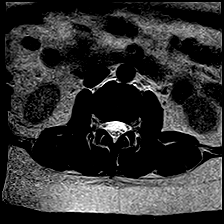
[im 21/24]
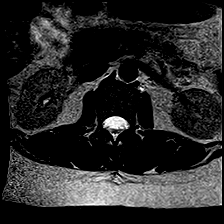
[im 24/24]
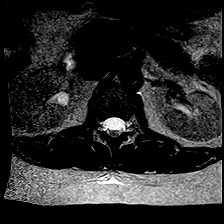

[Series 12: T1 · axial · 5.0mm · 0.89mm/px · z∈[-111,+97]mm · 9 of 24 slices shown (2 of 2)]
[im 1/24]
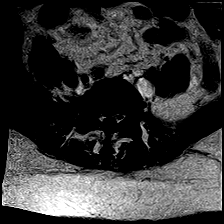
[im 3/24]
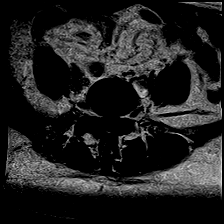
[im 6/24]
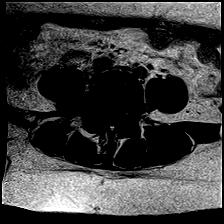
[im 9/24]
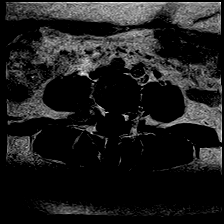
[im 12/24]
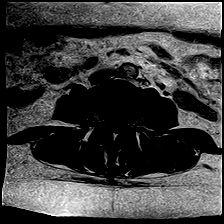
[im 15/24]
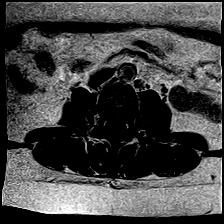
[im 18/24]
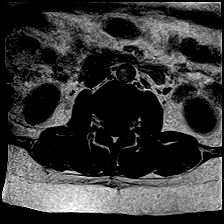
[im 21/24]
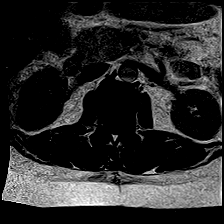
[im 24/24]
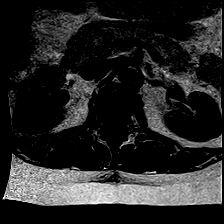

[48 of 48 positions shown; findings below may reference images not displayed]

FINDINGS: No acute bony lesions of lumbar vertebrae.  Conus terminates at T12-L1 level. Cauda structures are normal. 

At L1-2 level, no focal disc lesions are seen. 

At L2-3 level, mild facet arthropathy is noted without compromise of thecal sac or neural foramina similar to prior study. 

At L3-4 level, mild degenerative disc disease and bilateral facet arthropathy are causing mild compromise of both lateral recess similar to previous examination. 

At L4-5 level, significant degenerative disc disease with bulging annulus is noted with facet arthropathy causing moderately significant compromise of both lateral recess. Mild compromise of thecal sac with AP diameter in the midline measuring 12.7 mm.  Findings at this level are unchanged from previous examination.

At L5-S1 level, degenerative disc disease is noted with focal bulging annulus in the midline and to the right of the midline, 12 mm in transverse diameter mildly impinging on thecal sac and right lateral recess at this level.  This lesion is slightly more prominent in size compared with 12/06/2022.  This focal disc bulge is impinging on right S1 nerve root at this level.

Paravertebral soft tissues are unremarkable.
IMPRESSION: 1. No acute bony lesions of lumbar vertebrae. 

2. At L5-S1 level, degenerative disc disease is noted with focal bulging annulus in the midline and to the right of the midline, 12 mm in transverse diameter mildly impinging on thecal sac and right lateral recess at this level.  This lesion is slightly more prominent in size compared with 12/06/2022.  This focal disc bulge is impinging on right S1 nerve root at this level.

3. Findings at other disc levels are unchanged from previous examination.

## 2023-08-06 ENCOUNTER — Other Ambulatory Visit (HOSPITAL_COMMUNITY): Payer: 59

## 2023-08-06 ENCOUNTER — Ambulatory Visit: Payer: 59 | Attending: Family

## 2023-08-06 ENCOUNTER — Other Ambulatory Visit: Payer: Self-pay

## 2023-08-06 DIAGNOSIS — I10 Essential (primary) hypertension: Secondary | ICD-10-CM | POA: Insufficient documentation

## 2023-08-06 DIAGNOSIS — Z903 Acquired absence of stomach [part of]: Secondary | ICD-10-CM | POA: Insufficient documentation

## 2023-08-06 DIAGNOSIS — K912 Postsurgical malabsorption, not elsewhere classified: Secondary | ICD-10-CM | POA: Insufficient documentation

## 2023-08-06 DIAGNOSIS — E119 Type 2 diabetes mellitus without complications: Secondary | ICD-10-CM | POA: Insufficient documentation

## 2023-08-06 LAB — VITAMIN D 25 TOTAL: VITAMIN D 25, TOTAL: 97.93 ng/mL (ref 30.00–100.00)

## 2023-08-06 LAB — COMPREHENSIVE METABOLIC PANEL, NON-FASTING
ALBUMIN/GLOBULIN RATIO: 1.3 (ref 0.8–1.4)
ALBUMIN: 3.9 g/dL (ref 3.5–5.7)
ALKALINE PHOSPHATASE: 40 U/L (ref 34–104)
ALT (SGPT): 17 U/L (ref 7–52)
ANION GAP: 7 mmol/L (ref 4–13)
AST (SGOT): 15 U/L (ref 13–39)
BILIRUBIN TOTAL: 0.6 mg/dL (ref 0.3–1.0)
BUN/CREA RATIO: 23 — ABNORMAL HIGH (ref 6–22)
BUN: 16 mg/dL (ref 7–25)
CALCIUM, CORRECTED: 9 mg/dL (ref 8.9–10.8)
CALCIUM: 8.9 mg/dL (ref 8.6–10.3)
CHLORIDE: 107 mmol/L (ref 98–107)
CO2 TOTAL: 27 mmol/L (ref 21–31)
CREATININE: 0.71 mg/dL (ref 0.60–1.30)
ESTIMATED GFR: 108 mL/min/{1.73_m2} (ref >59)
GLOBULIN: 3 (ref 2.0–3.5)
GLUCOSE: 90 mg/dL (ref 74–109)
OSMOLALITY, CALCULATED: 282 mosm/kg (ref 270–290)
POTASSIUM: 4.1 mmol/L (ref 3.5–5.1)
PROTEIN TOTAL: 6.9 g/dL (ref 6.4–8.9)
SODIUM: 141 mmol/L (ref 136–145)

## 2023-08-06 LAB — CBC
HCT: 43 % — ABNORMAL HIGH (ref 31.2–41.9)
HGB: 14.6 g/dL — ABNORMAL HIGH (ref 10.9–14.3)
MCH: 31.9 pg (ref 24.7–32.8)
MCHC: 34 g/dL (ref 32.3–35.6)
MCV: 93.9 fL (ref 75.5–95.3)
MPV: 7.8 fL — ABNORMAL LOW (ref 7.9–10.8)
PLATELETS: 290 10*3/uL (ref 140–440)
RBC: 4.59 10*6/uL (ref 3.63–4.92)
RDW: 13.2 % (ref 12.3–17.7)
WBC: 6.6 10*3/uL (ref 3.8–11.8)

## 2023-08-06 LAB — IRON TRANSFERRIN AND TIBC
IRON (TRANSFERRIN) SATURATION: 18 % (ref 15–50)
IRON: 77 ug/dL (ref 50–212)
TOTAL IRON BINDING CAPACITY: 421 ug/dL (ref 250–450)
TRANSFERRIN: 301 mg/dL (ref 203–362)
UIBC: 344 ug/dL (ref 130–375)

## 2023-08-06 LAB — LIPID PANEL
CHOL/HDL RATIO: 3.9
CHOLESTEROL: 173 mg/dL (ref <200)
HDL CHOL: 44 mg/dL (ref >=40)
LDL CALC: 109 mg/dL — ABNORMAL HIGH (ref 0–100)
TRIGLYCERIDES: 98 mg/dL (ref <=150)
VLDL CALC: 20 mg/dL (ref 0–50)

## 2023-08-06 LAB — FOLATE: FOLATE: 13.4 ng/mL (ref 5.9–24.4)

## 2023-08-06 LAB — VITAMIN B12: VITAMIN B 12: 745 pg/mL (ref 180–914)

## 2023-08-06 LAB — HGA1C (HEMOGLOBIN A1C WITH EST AVG GLUCOSE): HEMOGLOBIN A1C: 5.7 % (ref 4.0–6.0)

## 2023-08-06 LAB — MAGNESIUM: MAGNESIUM: 1.9 mg/dL (ref 1.9–2.7)

## 2023-08-13 LAB — VITAMIN B1 (THIAMIN), WHOLE BLOOD: VITAMIN B1 (THIAMINE), BLOOD, LC/MS/MS: 168 nmol/L (ref 78–185)

## 2023-10-12 ENCOUNTER — Emergency Department (HOSPITAL_COMMUNITY): Payer: 59

## 2023-10-12 ENCOUNTER — Other Ambulatory Visit: Payer: Self-pay

## 2023-10-12 ENCOUNTER — Emergency Department
Admission: EM | Admit: 2023-10-12 | Discharge: 2023-10-12 | Disposition: A | Discharge status: Home / Self Care | Payer: 59 | Attending: Physician Assistant | Admitting: Physician Assistant

## 2023-10-12 ENCOUNTER — Encounter (HOSPITAL_COMMUNITY): Payer: Self-pay

## 2023-10-12 DIAGNOSIS — Z3200 Encounter for pregnancy test, result unknown: Secondary | ICD-10-CM | POA: Insufficient documentation

## 2023-10-12 DIAGNOSIS — M7989 Other specified soft tissue disorders: Secondary | ICD-10-CM

## 2023-10-12 DIAGNOSIS — L819 Disorder of pigmentation, unspecified: Secondary | ICD-10-CM

## 2023-10-12 DIAGNOSIS — M109 Gout, unspecified: Secondary | ICD-10-CM | POA: Insufficient documentation

## 2023-10-12 DIAGNOSIS — R609 Edema, unspecified: Secondary | ICD-10-CM | POA: Insufficient documentation

## 2023-10-12 DIAGNOSIS — M79604 Pain in right leg: Secondary | ICD-10-CM

## 2023-10-12 HISTORY — DX: Gout, unspecified: M10.9

## 2023-10-12 LAB — CBC WITH DIFF
BASOPHIL #: 0.1 10*3/uL (ref 0.00–0.10)
BASOPHIL %: 1 % (ref 0–1)
EOSINOPHIL #: 0.1 10*3/uL (ref 0.00–0.50)
EOSINOPHIL %: 1 % (ref 1–7)
HCT: 45.2 % — ABNORMAL HIGH (ref 31.2–41.9)
HGB: 15.3 g/dL — ABNORMAL HIGH (ref 10.9–14.3)
LYMPHOCYTE #: 3.2 10*3/uL — ABNORMAL HIGH (ref 1.00–3.00)
LYMPHOCYTE %: 36 % (ref 16–44)
MCH: 31.7 pg (ref 24.7–32.8)
MCHC: 33.9 g/dL (ref 32.3–35.6)
MCV: 93.5 fL (ref 75.5–95.3)
MONOCYTE #: 0.6 10*3/uL (ref 0.30–1.00)
MONOCYTE %: 7 % (ref 5–13)
MPV: 7.2 fL — ABNORMAL LOW (ref 7.9–10.8)
NEUTROPHIL #: 5 10*3/uL (ref 1.85–7.80)
NEUTROPHIL %: 56 % (ref 43–77)
PLATELETS: 317 10*3/uL (ref 140–440)
RBC: 4.84 10*6/uL (ref 3.63–4.92)
RDW: 13.2 % (ref 12.3–17.7)
WBC: 8.9 10*3/uL (ref 3.8–11.8)

## 2023-10-12 LAB — COMPREHENSIVE METABOLIC PANEL, NON-FASTING
ALBUMIN/GLOBULIN RATIO: 1.3 (ref 0.8–1.4)
ALBUMIN: 4.2 g/dL (ref 3.5–5.7)
ALKALINE PHOSPHATASE: 40 U/L (ref 34–104)
ALT (SGPT): 16 U/L (ref 7–52)
ANION GAP: 7 mmol/L (ref 4–13)
AST (SGOT): 16 U/L (ref 13–39)
BILIRUBIN TOTAL: 0.6 mg/dL (ref 0.3–1.0)
BUN/CREA RATIO: 19 (ref 6–22)
BUN: 18 mg/dL (ref 7–25)
CALCIUM, CORRECTED: 9.2 mg/dL (ref 8.9–10.8)
CALCIUM: 9.4 mg/dL (ref 8.6–10.3)
CHLORIDE: 107 mmol/L (ref 98–107)
CO2 TOTAL: 24 mmol/L (ref 21–31)
CREATININE: 0.96 mg/dL (ref 0.60–1.30)
ESTIMATED GFR: 75 mL/min/{1.73_m2} (ref >59)
GLOBULIN: 3.3 (ref 2.0–3.5)
GLUCOSE: 88 mg/dL (ref 74–109)
OSMOLALITY, CALCULATED: 277 mosm/kg (ref 270–290)
POTASSIUM: 4 mmol/L (ref 3.5–5.1)
PROTEIN TOTAL: 7.5 g/dL (ref 6.4–8.9)
SODIUM: 138 mmol/L (ref 136–145)

## 2023-10-12 LAB — HCG QUALITATIVE PREGNANCY, SERUM: PREGNANCY, SERUM QUALITATIVE: NEGATIVE

## 2023-10-12 LAB — GRAY TOP TUBE

## 2023-10-12 LAB — PT/INR
INR: 0.96 (ref 0.84–1.10)
PROTHROMBIN TIME: 10.9 s (ref 9.8–12.7)

## 2023-10-12 LAB — LIGHT GREEN TOP TUBE

## 2023-10-12 LAB — PTT (PARTIAL THROMBOPLASTIN TIME): APTT: 32.7 s (ref 25.0–38.0)

## 2023-10-12 LAB — GOLD TOP TUBE

## 2023-10-12 MED ORDER — IOHEXOL 350 MG IODINE/ML INTRAVENOUS SOLUTION
125.0000 mL | INTRAVENOUS | Status: AC
Start: 2023-10-12 — End: 2023-10-12
  Administered 2023-10-12: 125 mL via INTRAVENOUS

## 2023-10-12 NOTE — ED Nurses Note (Signed)
Pt. Provided with D/C instructions at this time. Pt. Has no further questions or concerns at this time. Pt. Taken out of ED via wheelchair at this time. VSS, NADN.

## 2023-10-12 NOTE — ED Provider Notes (Signed)
Emergency Medicine      Name: Taylor Beasley  Age and Gender: 43 y.o. female  Date of Birth: [birth year]  MRN: [id number]  PCP: Taylor Flemings, FNP-BC    CC:  Chief Complaint   Patient presents with    Edema     Right toes purple suddenly       HPI:  Taylor Beasley is a 43 y.o. White female who presents to the ER with purple discoloration of toes.  Patient states she had purplish discoloration of the toes of her right foot last night.  She says this morning, toes were back to normal color, but occurred again while at work.  She says she showed it to the school nurse who encouraged her to come to the ER for evaluation.  She reports chronic paresthesias of the right leg and foot and so she denies any new sensory changes in the foot or toes.  She is not a smoker.    Below pertinent information reviewed with patient:  Past Medical History:   Diagnosis Date    Gout, unspecified     History of PCOS     HTN (hypertension)            Allergies   Allergen Reactions    Azithromycin        Past Surgical History:   Procedure Laterality Date    BARIATRIC SURGERY      HX CARPAL TUNNEL RELEASE          Social History     Socioeconomic History    Marital status: Married   Tobacco Use    Smoking status: Never    Smokeless tobacco: Never   Substance and Sexual Activity    Alcohol use: Never    Drug use: Never       ROS:  No other overt positive review of systems are noted other than stated in the HPI.      Objective:    ED Triage Vitals [10/12/23 1329]   BP (Non-Invasive) (!) 154/97   Heart Rate 54   Respiratory Rate 18   Temperature 36.8 C (98.2 F)   SpO2 98 %   Weight 119 kg (263 lb)   Height 1.651 m (5\' 5" )     Filed Vitals:    10/12/23 1329 10/12/23 1750   BP: (!) 154/97 138/61   Pulse: 54    Resp: 18    Temp: 36.8 C (98.2 F)    SpO2: 98%        Nursing notes and vital signs reviewed.    Constitutional - No acute distress.  Alert and Active.  HEENT - Normocephalic. Conjunctiva clear. Moist mucous membranes.   Neck - Trachea  midline. No stridor. No hoarseness.  Cardiac - Regular rate and rhythm. No murmurs, rubs, or gallops. Intact distal pulses. Posterior tibialis pulses 2+, dorsalis pedis pulses 1+.  Respiratory/Chest - Normal respiratory effort. Clear to auscultation bilaterally. No rales, wheezes or rhonchi.   Musculoskeletal - Good AROM.  No clubbing, cyanosis or edema. No discoloration of the toes of either foot. Pulses present.  Skin - Warm and dry, without any rashes or other lesions.  Neuro - Alert and oriented x 3. Moving all extremities symmetrically.   Psych - Normal mood and affect. Behavior is normal         Any pertinent labs and imaging obtained during this encounter reviewed below in MDM.    MDM/ED Course:      Medical Decision Making  Patient presents to the ER with purple discoloration of toes.  Patient states she had purplish discoloration of the toes of her right foot last night.  She says this morning, toes were back to normal color, but occurred again while at work.  She says she showed it to the school nurse who encouraged her to come to the ER for evaluation.  She reports chronic paresthesias of the right leg and foot and so she denies any new sensory changes in the foot or toes.  She is not a smoker.    Differential diagnoses include:  Arterial occlusion, deep vein thrombosis, autoimmune disorder such as Raynaud's disease    Patient underwent diagnostics with results as noted in ED course.    No clear etiology of the patient's symptoms were found on her ER workup.  I have explained to her that she will need to follow up with her PCP for outpatient autoimmune workup and possible follow up with vascular surgery.    Patient will be discharged in stable condition at this time.    Amount and/or Complexity of Data Reviewed  Labs:  Decision-making details documented in ED Course.  Radiology: ordered. Decision-making details documented in ED Course.  ECG/medicine tests: independent interpretation performed.        ED  Course as of 10/12/23 1831   Wed Oct 12, 2023   1701 PERIPHERAL VENOUS DUPLEX - LOWER  No evidence for DVT in the right lower extremity/extremities from the level of the common femoral to popliteal veins.   1702 CBC/DIFF(!)  Hemoglobin 15.3, hematocrit 45.2   1702 COMPREHENSIVE METABOLIC PANEL, NON-FASTING  Unremarkable   1702 INR: 0.96   1702 PROTHROMBIN TIME: 10.9   1702 aPTT: 32.7   1702 PREGNANCY, SERUM QUALITATIVE: Negative   1756 CT ANGIO LOWER EXT RUNOFF INCL ABD AORTA RIGHT W IV CONT  NO ACUTE VASCULAR ABNORMALITY OR VISUALIZED STENOSIS.     2.9 CM OVOID HYPERENHANCING FOCUS WITHIN THE LEFT HEPATIC LOBE WHICH IS AN INDETERMINATE FINDING ON THIS EXAMINATION. THIS MAY REPRESENT A VASCULAR PHENOMENA GIVEN ARTERIAL PHASE CONTRAST. EVALUATION WITH DYNAMIC CONTRAST-ENHANCED MRI THE LIVER A PERFORMED ON AN OUTPATIENT BASIS FOR FURTHER EVALUATION IF CLINICALLY INDICATED.       Orders Placed This Encounter    CT ANGIO LOWER EXT RUNOFF INCL ABD AORTA RIGHT W IV CONT    COMPREHENSIVE METABOLIC PANEL, NON-FASTING    CBC/DIFF    PT/INR    PTT (PARTIAL THROMBOPLASTIN TIME)    CBC WITH DIFF    EXTRA TUBES    GOLD TOP TUBE    LIGHT GREEN TOP TUBE    GRAY TOP TUBE    HCG QUALITATIVE PREGNANCY, SERUM    PERIPHERAL VENOUS DUPLEX - LOWER    iohexol (OMNIPAQUE 350) infusion         Impression:   Clinical Impression   Pain and swelling of lower extremity, right   Discoloration of skin of toe (Primary)       Disposition: Discharged    / Maryagnes Amos PA-C  10/12/2023, 15:04  [facility], [place] Departments of Emergency Medicine  [facility]    Portions of this note may have been dictated using voice recognition software.     -----------------------  Results for orders placed or performed during the hospital encounter of 10/12/23 (from the past 12 hour(s))   COMPREHENSIVE METABOLIC PANEL, NON-FASTING   Result Value Ref Range    SODIUM 138 136 - 145 mmol/L    POTASSIUM 4.0  3.5 - 5.1 mmol/L     CHLORIDE 107 98 - 107 mmol/L    CO2 TOTAL 24 21 - 31 mmol/L    ANION GAP 7 4 - 13 mmol/L    BUN 18 7 - 25 mg/dL    CREATININE 1.61 0.96 - 1.30 mg/dL    BUN/CREA RATIO 19 6 - 22    ESTIMATED GFR 75 >59 mL/min/1.29m^2    ALBUMIN 4.2 3.5 - 5.7 g/dL    CALCIUM 9.4 8.6 - 04.5 mg/dL    GLUCOSE 88 74 - 409 mg/dL    ALKALINE PHOSPHATASE 40 34 - 104 U/L    ALT (SGPT) 16 7 - 52 U/L    AST (SGOT) 16 13 - 39 U/L    BILIRUBIN TOTAL 0.6 0.3 - 1.0 mg/dL    PROTEIN TOTAL 7.5 6.4 - 8.9 g/dL    ALBUMIN/GLOBULIN RATIO 1.3 0.8 - 1.4    OSMOLALITY, CALCULATED 277 270 - 290 mOsm/kg    CALCIUM, CORRECTED 9.2 8.9 - 10.8 mg/dL    GLOBULIN 3.3 2.0 - 3.5   PT/INR   Result Value Ref Range    PROTHROMBIN TIME 10.9 9.8 - 12.7 seconds    INR 0.96 0.84 - 1.10   PTT (PARTIAL THROMBOPLASTIN TIME)   Result Value Ref Range    APTT 32.7 25.0 - 38.0 seconds   CBC WITH DIFF   Result Value Ref Range    WBC 8.9 3.8 - 11.8 x10^3/uL    RBC 4.84 3.63 - 4.92 x10^6/uL    HGB 15.3 (H) 10.9 - 14.3 g/dL    HCT 81.1 (H) 91.4 - 41.9 %    MCV 93.5 75.5 - 95.3 fL    MCH 31.7 24.7 - 32.8 pg    MCHC 33.9 32.3 - 35.6 g/dL    RDW 78.2 95.6 - 21.3 %    PLATELETS 317 140 - 440 x10^3/uL    MPV 7.2 (L) 7.9 - 10.8 fL    NEUTROPHIL % 56 43 - 77 %    LYMPHOCYTE % 36 16 - 44 %    MONOCYTE % 7 5 - 13 %    EOSINOPHIL % 1 1 - 7 %    BASOPHIL % 1 0 - 1 %    NEUTROPHIL # 5.00 1.85 - 7.80 x10^3/uL    LYMPHOCYTE # 3.20 (H) 1.00 - 3.00 x10^3/uL    MONOCYTE # 0.60 0.30 - 1.00 x10^3/uL    EOSINOPHIL # 0.10 0.00 - 0.50 x10^3/uL    BASOPHIL # 0.10 0.00 - 0.10 x10^3/uL   HCG QUALITATIVE PREGNANCY, SERUM   Result Value Ref Range    PREGNANCY, SERUM QUALITATIVE Negative Negative, Indeterminate   GOLD TOP TUBE   Result Value Ref Range    RAINBOW/EXTRA TUBE AUTO RESULT Yes    LIGHT GREEN TOP TUBE   Result Value Ref Range    RAINBOW/EXTRA TUBE AUTO RESULT Yes    GRAY TOP TUBE   Result Value Ref Range    RAINBOW/EXTRA TUBE AUTO RESULT Yes      CT ANGIO LOWER EXT RUNOFF INCL ABD AORTA RIGHT W IV  CONT   Final Result   NO ACUTE VASCULAR ABNORMALITY OR VISUALIZED STENOSIS.      2.9 CM OVOID HYPERENHANCING FOCUS WITHIN THE LEFT HEPATIC LOBE WHICH IS AN INDETERMINATE FINDING ON THIS EXAMINATION. THIS MAY REPRESENT A VASCULAR PHENOMENA GIVEN ARTERIAL PHASE CONTRAST. EVALUATION WITH DYNAMIC CONTRAST-ENHANCED MRI THE LIVER A PERFORMED ON AN OUTPATIENT BASIS FOR FURTHER EVALUATION IF CLINICALLY INDICATED.  One or more dose reduction techniques were used (e.g., Automated exposure control, adjustment of the mA and/or kV according to patient size, use of iterative reconstruction technique).         Radiologist location ID: [id number]

## 2023-10-12 NOTE — ED APP Handoff Note (Signed)
[facility] - Emergency Department  Emergency Department  Provider in Triage Note    Name: JENAE MEADER  Age: 43 y.o.  Gender: female     Subjective:   EVYENIA GUZY is a 43 y.o. female who presents with complaint of Edema (Right toes purple suddenly)  . Noted toes being purple last night. PCP stated there was a temp difference and sent to the ER for further evaluation. No blood thinners. PCP diagnosed with gout last week and started on indomethacin.    Objective:   Filed Vitals:    10/12/23 1329   BP: (!) 154/97   Pulse: 54   Resp: 18   Temp: 36.8 C (98.2 F)   SpO2: 98%      Vitals are also documented in the EMR.    Focused Physical Exam shows no acute distress    Assessment:  A medical screening exam was completed.  This patient is a 43 y.o. female with Edema (Right toes purple suddenly)  .    Plan:  Please see initial orders and work-up in the EMR.  This is to be continued with full evaluation in the main Emergency Department.     No current facility-administered medications for this encounter.     No results found for this or any previous visit (from the past 24 hour(s)).       Stephannie Li PA-C, MPAS   10/12/2023, 13:29   Department of Emergency Medicine  [facility]

## 2023-10-12 NOTE — ED Triage Notes (Signed)
Low back pain into right leg "few days" right toes turned purple last night and improved this AM but has now returned to purple color.

## 2023-10-12 NOTE — ED Nurses Note (Signed)
Pt. Arrived to the ED via triage with c/o R toe discoloration and cool to touch with lower back pain radiating to right leg. Pt. Reports chronic back pain X 1 year with R leg pain, reports new onset of purple toe discoloration last night. On arrival no discoloration is noted. Bilateral feet are cool to touch. DP pulses +2 bilaterally. Cap refill <3 seconds. VSS.

## 2023-10-12 NOTE — Discharge Instructions (Signed)
Follow up with your PCP for further evaluation.  You will likely benefit from a referral for vascular surgery evaluation as well as evaluation for autoimmune disorders such as Raynaud's.  Keep toes warm.  Return to the ER for other emergencies or as needed.

## 2023-10-18 ENCOUNTER — Other Ambulatory Visit (HOSPITAL_COMMUNITY): Payer: Self-pay | Admitting: Family

## 2023-10-18 DIAGNOSIS — K769 Liver disease, unspecified: Secondary | ICD-10-CM

## 2023-11-07 ENCOUNTER — Ambulatory Visit (HOSPITAL_COMMUNITY): Payer: 59

## 2023-11-09 ENCOUNTER — Ambulatory Visit: Payer: 59 | Attending: Rheumatology

## 2023-11-09 ENCOUNTER — Other Ambulatory Visit: Payer: Self-pay

## 2023-11-09 DIAGNOSIS — Z79899 Other long term (current) drug therapy: Secondary | ICD-10-CM | POA: Insufficient documentation

## 2023-11-09 LAB — COMPREHENSIVE METABOLIC PANEL, NON-FASTING
ALBUMIN/GLOBULIN RATIO: 1.3 (ref 0.8–1.4)
ALBUMIN: 4.3 g/dL (ref 3.5–5.7)
ALKALINE PHOSPHATASE: 38 U/L (ref 34–104)
ALT (SGPT): 19 U/L (ref 7–52)
ANION GAP: 13 mmol/L (ref 4–13)
AST (SGOT): 19 U/L (ref 13–39)
BILIRUBIN TOTAL: 0.8 mg/dL (ref 0.3–1.0)
BUN/CREA RATIO: 22 (ref 6–22)
BUN: 18 mg/dL (ref 7–25)
CALCIUM, CORRECTED: 9.3 mg/dL (ref 8.9–10.8)
CALCIUM: 9.5 mg/dL (ref 8.6–10.3)
CHLORIDE: 105 mmol/L (ref 98–107)
CO2 TOTAL: 22 mmol/L (ref 21–31)
CREATININE: 0.82 mg/dL (ref 0.60–1.30)
ESTIMATED GFR: 91 mL/min/{1.73_m2} (ref >59)
GLOBULIN: 3.4 (ref 2.0–3.5)
GLUCOSE: 87 mg/dL (ref 74–109)
OSMOLALITY, CALCULATED: 281 mosm/kg (ref 270–290)
POTASSIUM: 3.8 mmol/L (ref 3.5–5.1)
PROTEIN TOTAL: 7.7 g/dL (ref 6.4–8.9)
SODIUM: 140 mmol/L (ref 136–145)

## 2023-11-09 LAB — CBC WITH DIFF
BASOPHIL #: 0 10*3/uL (ref 0.00–0.10)
BASOPHIL %: 1 % (ref 0–1)
EOSINOPHIL #: 0.1 10*3/uL (ref 0.00–0.50)
EOSINOPHIL %: 1 % (ref 1–7)
HCT: 49.2 % — ABNORMAL HIGH (ref 31.2–41.9)
HGB: 16.5 g/dL — ABNORMAL HIGH (ref 10.9–14.3)
LYMPHOCYTE #: 2.9 10*3/uL (ref 1.10–3.10)
LYMPHOCYTE %: 29 % (ref 16–46)
MCH: 31.7 pg (ref 24.7–32.8)
MCHC: 33.5 g/dL (ref 32.3–35.6)
MCV: 94.5 fL (ref 75.5–95.3)
MONOCYTE #: 0.6 10*3/uL (ref 0.20–0.90)
MONOCYTE %: 6 % (ref 4–11)
MPV: 7.3 fL — ABNORMAL LOW (ref 7.9–10.8)
NEUTROPHIL #: 6.3 10*3/uL (ref 1.90–8.20)
NEUTROPHIL %: 64 % (ref 43–77)
PLATELETS: 313 10*3/uL (ref 140–440)
RBC: 5.21 10*6/uL — ABNORMAL HIGH (ref 3.63–4.92)
RDW: 14 % (ref 12.3–17.7)
WBC: 9.9 10*3/uL (ref 3.8–11.8)

## 2023-11-09 LAB — URIC ACID: URIC ACID: 5.4 mg/dL (ref 2.3–7.6)

## 2023-11-10 LAB — CYCLIC CITRULLINATED PEPTIDE ANTIBODIES, IGG, SERUM
CYCLIC CITRULLINATED PEPTIDE ANTIBODY IGG QUAL: NEGATIVE
CYCLIC CITRULLINATED PEPTIDE ANTIBODY IGG QUANT: <0.5 U/mL (ref <3.0)

## 2023-11-10 LAB — HEP-2 SUBSTRATE ANTINUCLEAR ANTIBODIES (ANA), SERUM: ANA INTERPRETATION: NEGATIVE

## 2023-11-10 LAB — RHEUMATOID FACTOR, SERUM: RHEUMATOID FACTOR: <13 [IU]/mL (ref <=30)

## 2023-11-11 LAB — PHOSPHOLIPID (CARDIOLIPIN) IGA
CARDIOLIPIN IGA QUALITATIVE: NEGATIVE
CARDIOLIPIN IGA QUANTITATIVE: <2 U/mL (ref <=19)

## 2023-11-11 LAB — PHOSPHOLIPID (CARDIOLIPIN) IGG & IGM
CARDIOLIPIN IGG QUALITATIVE: NEGATIVE
CARDIOLIPIN IGG QUANTITATIVE: <1.6 U/mL (ref <=19)
CARDIOLIPIN IGM QUALITATIVE: NEGATIVE
CARDIOLIPIN IGM QUANTITATIVE: <1.5 U/mL (ref <=19)

## 2023-11-13 LAB — LUPUS ANTICOAGULANT
LAC NORMALIZED RATIO: 0.99 (ref <=1.20)
PATHOLOGIST INTERPRETATION LUPUS: NORMAL
SCT NORMALIZED RATIO: 0.96 (ref <=1.16)

## 2023-11-13 LAB — MYCOPLASMA PNEUMONIAE ANTIBODIES, IGG AND IGM, SERUM
M. PNEUMONIAE AB (IGG): 3.21 — ABNORMAL HIGH (ref <=0.90)
M. PNEUMONIAE AB (IGM): 39 U/mL (ref <770)

## 2023-11-14 LAB — HEPATITIS C VIRUS (HCV) GENOTYPE, PLASMA: HEPATITIS C: NOT DETECTED

## 2023-11-15 LAB — CRYOGLOBULIN,SERUM

## 2023-11-19 ENCOUNTER — Other Ambulatory Visit: Payer: Self-pay

## 2023-11-19 ENCOUNTER — Ambulatory Visit
Admission: RE | Admit: 2023-11-19 | Discharge: 2023-11-19 | Disposition: A | Discharge status: Home / Self Care | Payer: 59 | Source: Ambulatory Visit | Attending: Family | Admitting: Family

## 2023-11-19 DIAGNOSIS — K769 Liver disease, unspecified: Secondary | ICD-10-CM | POA: Insufficient documentation

## 2023-11-19 MED ORDER — GADOBUTROL 10 MMOL/10 ML (1 MMOL/ML) INTRAVENOUS SOLUTION
10.0000 mL | INTRAVENOUS | Status: AC
Start: 2023-11-19 — End: 2023-11-19
  Administered 2023-11-19: 10 mL via INTRAVENOUS

## 2024-05-23 ENCOUNTER — Emergency Department (HOSPITAL_COMMUNITY)

## 2024-05-23 ENCOUNTER — Emergency Department
Admission: EM | Admit: 2024-05-23 | Discharge: 2024-05-23 | Disposition: A | Discharge status: Home / Self Care | Attending: PHYSICIAN ASSISTANT | Admitting: PHYSICIAN ASSISTANT

## 2024-05-23 ENCOUNTER — Other Ambulatory Visit: Payer: Self-pay

## 2024-05-23 DIAGNOSIS — W07XXXA Fall from chair, initial encounter: Secondary | ICD-10-CM | POA: Insufficient documentation

## 2024-05-23 DIAGNOSIS — S139XXA Sprain of joints and ligaments of unspecified parts of neck, initial encounter: Secondary | ICD-10-CM

## 2024-05-23 DIAGNOSIS — S134XXA Sprain of ligaments of cervical spine, initial encounter: Secondary | ICD-10-CM | POA: Insufficient documentation

## 2024-05-23 DIAGNOSIS — M533 Sacrococcygeal disorders, not elsewhere classified: Secondary | ICD-10-CM

## 2024-05-23 DIAGNOSIS — S335XXA Sprain of ligaments of lumbar spine, initial encounter: Secondary | ICD-10-CM | POA: Insufficient documentation

## 2024-05-23 DIAGNOSIS — S0990XA Unspecified injury of head, initial encounter: Secondary | ICD-10-CM | POA: Insufficient documentation

## 2024-05-23 DIAGNOSIS — Y990 Civilian activity done for income or pay: Secondary | ICD-10-CM | POA: Insufficient documentation

## 2024-05-23 DIAGNOSIS — S300XXA Contusion of lower back and pelvis, initial encounter: Secondary | ICD-10-CM | POA: Insufficient documentation

## 2024-05-23 DIAGNOSIS — M47816 Spondylosis without myelopathy or radiculopathy, lumbar region: Secondary | ICD-10-CM | POA: Insufficient documentation

## 2024-05-23 DIAGNOSIS — M545 Low back pain, unspecified: Secondary | ICD-10-CM

## 2024-05-23 MED ORDER — KETOROLAC 60 MG/2 ML INTRAMUSCULAR SOLUTION
60.0000 mg | INTRAMUSCULAR | Status: AC
Start: 2024-05-23 — End: 2024-05-23
  Administered 2024-05-23: 60 mg via INTRAMUSCULAR

## 2024-05-23 MED ORDER — KETOROLAC 60 MG/2 ML INTRAMUSCULAR SOLUTION
INTRAMUSCULAR | Status: AC
Start: 2024-05-23 — End: 2024-05-23
  Filled 2024-05-23: qty 2

## 2024-05-23 MED ORDER — METHOCARBAMOL 500 MG TABLET
1000.0000 mg | ORAL_TABLET | Freq: Three times a day (TID) | ORAL | 0 refills | Status: AC
Start: 2024-05-23 — End: 2024-06-02

## 2024-05-23 NOTE — Discharge Instructions (Addendum)
*  Take medications as prescribed  *OTC Motrin/Tylenol jeronimo as needed  * gentle stretching  * apply heat  * drink plenty of water  * make yourself get up and move around  *follow up with family doctor  *return to ER sooner as needed

## 2024-05-23 NOTE — ED APP Handoff Note (Signed)
 [facility] - Emergency Department  Emergency Department  Provider in Triage Note    Name: Taylor Beasley  Age: 43 y.o.  Gender: female     Subjective:   Taylor Beasley is a 43 y.o. female who presents with complaint of Tailbone Pain and Back Pain  . Sitting in a chair and the leg broke. Fell and hit back of head. No LOC. Pain in low back pain and tailbone.     Objective:   Filed Vitals:    05/23/24 1601   BP: (!) 147/100   Pulse: 82   Resp: 16   Temp: 36.2 C (97.2 F)   SpO2: 97%      Vitals are also documented in the EMR.    Focused Physical Exam shows no acute distress     Assessment:  A medical screening exam was completed.  This patient is a 43 y.o. female with Tailbone Pain and Back Pain  .    Plan:  Please see initial orders and work-up in the EMR.  This is to be continued with full evaluation in the main Emergency Department.     No current facility-administered medications for this encounter.     No results found for this or any previous visit (from the past 24 hours).       Lauraine LITTIE Crock PA-C, MPAS   05/23/2024, 16:01   Department of Emergency Medicine  [facility]

## 2024-05-23 NOTE — ED Provider Notes (Signed)
 Emergency Department  [facility]  05/23/2024     Taylor Beasley  1981/07/02  43 y.o.  female  [place] [address]   [phone] (home)  PCP: Elouise Stabs, FNP-BC   Date of service:05/23/2024 22:31    Chief Complaint:   Chief Complaint   Patient presents with    Tailbone Pain    Back Pain           HPI: Patient is a 43 y.o. female who presents to the emergency department pain after fall.  Patient was at work sitting on a stool.  One of the legs on the stool broke and patient fell to the floor.  Landed on her butt.  She did strike her head.  No LOC.  No blood thinners.  Patient has some mild pain to back of her head.  Mild neck pain.  In the low back and tailbone.  Pain is worse with sitting.        Past Medical History:   Past Medical History:   Diagnosis Date    Gout, unspecified     History of PCOS     HTN (hypertension)      (Not in an outpatient encounter)     Past Surgical History:   Past Surgical History:   Procedure Laterality Date    Bariatric surgery      Hx carpal tunnel release         Social History: Social History[1]     Family History:  No family history on file.  Oncology History    No problem history exists.        Medications Prior to Admission       Prescriptions    ondansetron  (ZOFRAN  ODT) 4 mg Oral Tablet, Rapid Dissolve    Take 1 Tablet (4 mg total) by mouth Every 8 hours as needed for Nausea/Vomiting            Allergies: Allergies[2]    Above history reviewed with patient.  Allergies, medication list, reviewed.        Physical exam:  Physical Exam  Body mass index is 44.1 kg/m.  ED Triage Vitals [05/23/24 1601]   BP (Non-Invasive) (!) 147/100   Heart Rate 82   Respiratory Rate 16   Temperature 36.2 C (97.2 F)   SpO2 97 %   Weight 120 kg (265 lb)   Height 1.651 m (5' 5)       Constitutional: patient is oriented to person, place, and time and well-developed, well-nourished, and in no distress.   HENT:   Head: Normocephalic  Right Ear: External ear normal.   Left Ear: External  ear normal.   Nose: Nose normal.   Eyes: Pupils are equal, round, and reactive to light. Conjunctivae and EOM are normal.   Neck: Normal range of motion. Neck supple.  Mild paraspinal tenderness.  Cardiovascular: Normal rate, regular rhythm, normal heart sounds and intact distal pulses.   Pulmonary/Chest: Effort normal and breath sounds normal.   Musculoskeletal: Normal range of motion.  Tenderness across the lumbar spine and over the coccyx.  Neurological:Patient is alert and oriented to person, place, and time.  Antalgic gait. GCS score is 15.   Skin: Skin is warm and dry.   Psychiatric: Mood, memory, affect and judgment normal.        The following orders were placed after examining the patient :  Orders Placed This Encounter    XR LUMBAR SPINE AP/OBLIQUES/LAT/SPOT    XR SACRUM AND  COCCYX    ketorolac  (TORADOL ) 60mg /2 mL IM injection    methocarbamoL  (ROBAXIN ) 500 mg Oral Tablet      XR LUMBAR SPINE AP/OBLIQUES/LAT/SPOT   Final Result   MILD DEGENERATIVE CHANGES OF THE LUMBAR SPINE.                     Radiologist location ID: [id number]         XR SACRUM AND COCCYX   Final Result   Probable developmental or least nonacute segmentation alignment at the mid coccyx   No definite cortical fracture lucency appreciated   If there is continued pain and concern for occult bony injury related to the recent event, follow-up with bone scan or MRI could BE considered               Radiologist location ID: [id number]            No results found for this or any previous visit (from the past 12 hours).      ED Course:   ED Course as of 05/24/24 1320   Wed May 23, 2024   1712 XR SACRUM AND COCCYX  Probable developmental or least nonacute segmentation alignment at the mid coccyx  No definite cortical fracture lucency appreciated  If there is continued pain and concern for occult bony injury related to the recent event, follow-up with bone scan or MRI could BE considered     1712 XR LUMBAR SPINE AP/OBLIQUES/LAT/SPOT  MILD  DEGENERATIVE CHANGES OF THE LUMBAR SPINE.      Medications Ordered/Administered in the ED   ketorolac  (TORADOL ) 60mg /2 mL IM injection (60 mg IntraMUSCULAR Given 05/23/24 1734)               Emergency Department Procedure:    Procedures    Medical Decision Making  Patient is a 43 year old female that presents to the ER after a fall at work.  Patient landed on her tailbone.  Patient experiencing pain in the back of her head, neck, tailbone and low back.  X-ray of the coccyx and lumbar is unremarkable.  Tenderness of the neck is paraspinal.  CT head not warranted based on mechanism and current symptoms.  Patient given Toradol  60 mg IM.  Workers comp form filled out.  Return precautions discussed at length and patient verbalized understanding.    Amount and/or Complexity of Data Reviewed  Radiology: ordered. Decision-making details documented in ED Course.    Risk  Prescription drug management.            Findings and diagnosis discussed with patient.  Clinical Impression:   Clinical Impression   Cervical sprain, initial encounter (Primary)   Lumbar sprain, initial encounter   Coccyx contusion, initial encounter   Head injury         Disposition: Discharged          No follow-ups on file.   Discharge Medication List as of 05/23/2024  5:36 PM        START taking these medications    Details   methocarbamoL  (ROBAXIN ) 500 mg Oral Tablet Take 2 Tablets (1,000 mg total) by mouth Three times a day for 10 days, Disp-30 Tablet, R-0, E-Rx            Darrel Carne, FNP-BC  [address]  [place] [address]  [phone]    In 2 days        Future Appointments  No future appointments.     BP (!) 156/102  Pulse 73   Temp 36.3 C (97.3 F)   Resp 16   Ht 1.651 m (5' 5)   Wt 120 kg (265 lb)   SpO2 99%   BMI 44.10 kg/m        Lauraine LITTIE Crock, PA-C  05/23/2024              This note was partially created using voice recognition software and is inherently subject to errors including those of syntax and sound alike   substitutions which may escape proof reading.  In such instances, original meaning may be extrapolated by contextual derivation.         [1]   Social History  Tobacco Use    Smoking status: Never    Smokeless tobacco: Never   Substance Use Topics    Alcohol use: Never    Drug use: Never   [2]   Allergies  Allergen Reactions    Azithromycin

## 2024-05-23 NOTE — ED Triage Notes (Signed)
 States sitting on a stool and the leg gave out landing on her buttock and hitting the back of her head. Denies LOC. Reports tailbone pain and head pain.

## 2024-05-23 NOTE — ED Nurses Note (Signed)
 Pt evaluated and treated by medical provider while in waiting room area. No primary RN assigned; therefore, no assessment performed. All paperwork given and questions answered. Pt acknowledged all understanding. Leaving waiting area.

## 2024-05-30 ENCOUNTER — Other Ambulatory Visit (HOSPITAL_COMMUNITY): Payer: Self-pay | Admitting: Family

## 2024-05-30 DIAGNOSIS — K760 Fatty (change of) liver, not elsewhere classified: Secondary | ICD-10-CM

## 2024-06-19 ENCOUNTER — Encounter (HOSPITAL_COMMUNITY): Payer: Self-pay | Admitting: Emergency Medicine

## 2024-06-19 ENCOUNTER — Other Ambulatory Visit: Payer: Self-pay

## 2024-06-19 ENCOUNTER — Emergency Department
Admission: EM | Admit: 2024-06-19 | Discharge: 2024-06-19 | Disposition: A | Discharge status: Home / Self Care | Attending: Emergency Medicine | Admitting: Emergency Medicine

## 2024-06-19 DIAGNOSIS — S46811A Strain of other muscles, fascia and tendons at shoulder and upper arm level, right arm, initial encounter: Secondary | ICD-10-CM | POA: Insufficient documentation

## 2024-06-19 DIAGNOSIS — Z9884 Bariatric surgery status: Secondary | ICD-10-CM | POA: Insufficient documentation

## 2024-06-19 DIAGNOSIS — M67921 Unspecified disorder of synovium and tendon, right upper arm: Secondary | ICD-10-CM | POA: Insufficient documentation

## 2024-06-19 DIAGNOSIS — X58XXXA Exposure to other specified factors, initial encounter: Secondary | ICD-10-CM | POA: Insufficient documentation

## 2024-06-19 DIAGNOSIS — M7521 Bicipital tendinitis, right shoulder: Secondary | ICD-10-CM

## 2024-06-19 DIAGNOSIS — S46001A Unspecified injury of muscle(s) and tendon(s) of the rotator cuff of right shoulder, initial encounter: Secondary | ICD-10-CM | POA: Insufficient documentation

## 2024-06-19 MED ORDER — SENNOSIDES 8.6 MG TABLET
8.6000 mg | ORAL_TABLET | Freq: Every evening | ORAL | 0 refills | Status: AC
Start: 2024-06-19 — End: 2024-06-29

## 2024-06-19 MED ORDER — HYDROCODONE 10 MG-ACETAMINOPHEN 325 MG TABLET
1.0000 | ORAL_TABLET | Freq: Four times a day (QID) | ORAL | 0 refills | Status: AC | PRN
Start: 2024-06-19 — End: ?

## 2024-06-19 NOTE — ED Provider Notes (Signed)
 [facility]  ED Primary Provider Note  Patient Name: Taylor Beasley  Patient Age: 43 y.o.  Date of Birth: June 19, 1981    Chief Complaint: Arm Pain        History of Present Illness  Taylor Beasley is a 43 year old female who presents with right shoulder pain radiating down the arm.    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, with surgery in 1999  - Current pain differs from previous carpal tunnel symptoms, being more focused on the shoulder and arm rather than the hand    Associated symptoms and constitutional review  - No chest pain, shortness of breath, nausea, or vomiting  - History of panic attacks, but no recent abnormal episodes    Lower extremity numbness  - Previously evaluated for numbness in the leg, which remains unresolved despite consultations with various specialists, including a neurosurgeon in [place]     Surgical history  - History of gastric bypass surgery over a year and a half ago    Treatments prior to arrival: She has been taking ibuprofen and Tylenol  for the past several days.  It is important to note that she does have a history of gastric bypass surgery.    PMH: reviewed and listed on the chart below.     SH: reviewed and listed on the chart below.     Social History: reviewed and listed on the chart below.     Family History: reviewed and listed on the chart below.       PMHx:    Past Medical History:    Diagnosis Date    Gout, unspecified     History of PCOS     HTN (hypertension)         PSHx:   Past Surgical History:   Procedure Laterality Date    BARIATRIC SURGERY      HX CARPAL TUNNEL RELEASE          Allergies:    Allergies[1]     Social History  Social History[2]    Family History  Family Medical History:    None           Home Meds: Reviewed and listed on the chart     Prior to Admission medications   Medication Sig Start Date End Date Taking? Authorizing Provider   HYDROcodone -acetaminophen  (NORCO) 10-325 mg Oral Tablet Take 1 Tablet by mouth Every 6 hours as needed for Pain for up to 12 doses 06/19/24  Yes Claudene Mix, DO   methocarbamoL  (ROBAXIN ) 500 mg Oral Tablet Take 2 Tablets (1,000 mg total) by mouth Three times a day for 10 days 05/23/24 06/02/24  [place], PA-C   ondansetron  (ZOFRAN  ODT) 4 mg Oral Tablet, Rapid Dissolve Take 1 Tablet (4 mg total) by mouth Every 8 hours as needed for Nausea/Vomiting 02/27/22   Gretel Craig POUR, PA-C  sennosides (SENNA) 8.6 mg Oral Tablet Take 1 Tablet (8.6 mg total) by mouth Every evening for 10 days 06/19/24 06/29/24 Yes Claudene Mix, DO         ROS: A total of 10 systems were reviewed by me at the time of the visit. All are negative except the HPI and the following.           Physical Exam   ED Triage Vitals [06/19/24 0638]   BP (Non-Invasive) (!) 137/100   Heart Rate 74   Respiratory Rate 18   Temperature 36.3 C (97.4 F)   SpO2 100 %   Weight 120 kg (265 lb)   Height 1.651 m (5' 5)         Physical exam  Constitutional/general: The nursing notes were reviewed and agreed upon.  The vital signs were reviewed and are listed on the chart.   43 year old female who is in no acute distress.  Patient appears to be uncomfortable with mild-to-moderate pain  HEENT: Eyes show normal extraocular movements.  Well-hydrated oral mucosa is noted.  There is no facial trauma or abnormalities.  Neck: No midline tenderness, no meningeal signs.  Full range of motion.  No JVD.     Cardiovascular: Heart is regular rate and rhythm S1-S2 sounds were auscultated without murmur click or rub  Respiratory: Lungs are clear to auscultation in all fields without wheeze rale or rhonchi.  GI: Abdomen is soft non tender normal bowel sounds are auscultated.  There is no rebound tenderness or guarding.  Obese abdomen with a BMI of 44.   Neuro cranial nerves II through XII are intact and normal.  Patient has normal speech and normal gait.  There is no muscle weakness in any extremities.  Sensation is intact throughout.   Psych: Patient is alert and oriented person place and time.  Patient is very pleasant converse with has a euthymic affect.  There are no signs of depression or anxiety.  Skin: No rash.  No petechiae or purpura.  The skin is warm and dry without diaphoresis.  There is no pallor.  Musculoskeletal: There is no tenderness to palpation in any body region.  There is no lower extremity edema .  Left upper extremity: Full range of motion in all planes.  No tenderness to palpation in any region of the left shoulder.    Right upper extremity:  Point tenderness to palpation over the mid body of deltoid.  Tenderness to palpation to the short and long head of biceps tendon.  Tenderness to palpation to teres minor but this is only minimal.  Minimal tenderness to palpation to the midbody of trapezius.  Patient is severely limited in range of motion in the right shoulder.  Internal rotation is limited to mid axillary line.  Flexion to 70.  Ab duction to 60 adduction to 90  Patient also has mild tenderness to palpation to the right middle and posterior scalenes musculature.  I feel that this is mainly from the body guarding against movement of the right shoulder.    GU: Deferred        Procedures      Patient Data   Labs Ordered/Reviewed - No data to display               No orders to display                Medical Decision Making  I spent 10 minutes explaining the plan to the patient.  I also  explain  the exercises and gave her handout of the exercises that I expect her to perform an also which was I do not want her to performed.  She is established with a physical therapy group in town and wants to continue with body works.  She has not seen an orthopedic surgeon.  However I told her to see [facility]    Problems Addressed:  Biceps tendinopathy, right: acute illness or injury  H/O gastric bypass: chronic illness or injury  Injury of right rotator cuff, initial encounter: acute illness or injury  Strain of right deltoid muscle, initial encounter: acute illness or injury    Risk  OTC drugs.  Parenteral controlled substances.        Medical Decision Making  43 year old female with a history of right carpal tunnel release and gastric bypass presented with acute right shoulder pain radiating down the arm, worsened by movement, without significant numbness or tingling. Exam revealed pain-limited range of motion, tenderness over the deltoid and biceps tendon, and no evidence of trauma. No chest pain, shortness of breath, or systemic symptoms were reported. The right shoulder was noted to be lower than the left, which is baseline for the patient.    Right rotator cuff, biceps tendon, and deltoid strain  - Discontinued ibuprofen due to gastric bypass history and risk of GI complications.  - Prescribed pain medication for bedtime and as needed during the day if not engaged in activities.  - Recommended physical therapy and provided exercises within pain limits.  - Referred to orthopedic surgery for further evaluation and management.  - Advised against sling use to prevent frozen shoulder.  - Discussed potential future use of a cortisone shot if symptoms persist after a week of stretching exercises.  - Advised on the use of Senokot to prevent constipation from pain medication.  - Discharged home.                                Clinical Impression   Injury of right rotator cuff, initial encounter -  Subscapularis (Primary)   Strain of right deltoid muscle, initial encounter   Biceps tendinopathy, right   H/O gastric bypass           Discharged      Current Discharge Medication List        START taking these medications    Details   HYDROcodone -acetaminophen  (NORCO) 10-325 mg Oral Tablet Take 1 Tablet by mouth Every 6 hours as needed for Pain for up to 12 doses  Qty: 12 Tablet, Refills: 0      sennosides (SENNA) 8.6 mg Oral Tablet Take 1 Tablet (8.6 mg total) by mouth Every evening for 10 days  Qty: 10 Tablet, Refills: 0                              _______________________________  Cheryal Sharps, D.O.  Emergency Medicine  [facility]          This note may have been partially generated using MModal Fluency Direct system and [employer]bridge ai software. There may be some incorrect words, spellings, and punctuation that were not noted in checking the note before saving, though effort was made to avoid such errors.      This note was created with assistance from [employer]bridge via capture of conversational audio.  Consent was obtained from the  patient and all parties present prior to recording.          [1]   Allergies  Allergen Reactions    Azithromycin    [2]   Social History  Tobacco Use    Smoking status: Never    Smokeless tobacco: Never   Substance Use Topics    Alcohol use: Never    Drug use: Never

## 2024-06-19 NOTE — Discharge Instructions (Signed)
 See an orthopedic surgeon from the list provided below within 1 week  Follow the rotator cuff range of motion exercises as directed   Avoid any and all NSAIDs unless absolutely necessary due to your gastric bypass surgery in the past this does include ibuprofen  Norco every 6 hours as needed for pain.  If you can not tolerate sleepiness or if you need to work/drive a vehicle do not take this medication.  He did take it at night to get good rest  Senokot S at bedtime to prevent constipation from the [facility]   Physical therapy  Return to the ER for any emergencies  As long as you are not taking the Norco through the day you can take Tylenol .  Just be aware that the Norco does have 325 mg of Tylenol  in it.  Your maximum Tylenol  dose in 24 hours is 4000 mg

## 2024-06-19 NOTE — ED Triage Notes (Signed)
 Reports R upper arm pain radiating down the arm since Wednesday night. No known injury. Reports pain has been controlled with OTC meds until today.

## 2024-06-27 ENCOUNTER — Other Ambulatory Visit (HOSPITAL_COMMUNITY): Payer: Self-pay

## 2024-06-27 DIAGNOSIS — M545 Low back pain, unspecified: Secondary | ICD-10-CM

## 2024-06-27 DIAGNOSIS — M5416 Radiculopathy, lumbar region: Secondary | ICD-10-CM

## 2024-06-27 DIAGNOSIS — S300XXA Contusion of lower back and pelvis, initial encounter: Secondary | ICD-10-CM

## 2024-06-29 ENCOUNTER — Ambulatory Visit: Attending: Family

## 2024-06-29 ENCOUNTER — Other Ambulatory Visit: Payer: Self-pay

## 2024-06-29 DIAGNOSIS — K760 Fatty (change of) liver, not elsewhere classified: Secondary | ICD-10-CM | POA: Insufficient documentation

## 2024-06-30 LAB — ALPHA FETOPROTEIN (AFP) TUMOR MARKER: AFP TUMOR MARKER: 3 ng/mL (ref <=9)

## 2024-07-02 ENCOUNTER — Ambulatory Visit: Attending: Family

## 2024-07-02 ENCOUNTER — Other Ambulatory Visit: Payer: Self-pay

## 2024-07-02 DIAGNOSIS — K219 Gastro-esophageal reflux disease without esophagitis: Secondary | ICD-10-CM | POA: Insufficient documentation

## 2024-07-02 DIAGNOSIS — K769 Liver disease, unspecified: Secondary | ICD-10-CM | POA: Insufficient documentation

## 2024-07-02 DIAGNOSIS — I10 Essential (primary) hypertension: Secondary | ICD-10-CM | POA: Insufficient documentation

## 2024-07-02 DIAGNOSIS — F419 Anxiety disorder, unspecified: Secondary | ICD-10-CM | POA: Insufficient documentation

## 2024-07-02 DIAGNOSIS — J918 Pleural effusion in other conditions classified elsewhere: Secondary | ICD-10-CM | POA: Insufficient documentation

## 2024-07-02 LAB — COMPREHENSIVE METABOLIC PANEL, NON-FASTING
ALBUMIN/GLOBULIN RATIO: 1.4 (ref 0.8–1.4)
ALBUMIN: 4.7 g/dL (ref 3.5–5.7)
ALKALINE PHOSPHATASE: 50 U/L (ref 34–104)
ALT (SGPT): 50 U/L (ref 7–52)
ANION GAP: 10 mmol/L (ref 4–13)
AST (SGOT): 46 U/L — ABNORMAL HIGH (ref 13–39)
BILIRUBIN TOTAL: 0.6 mg/dL (ref 0.3–1.0)
BUN/CREA RATIO: 22 (ref 6–22)
BUN: 21 mg/dL (ref 7–25)
CALCIUM, CORRECTED: 9.5 mg/dL (ref 8.9–10.8)
CALCIUM: 10.1 mg/dL (ref 8.6–10.3)
CHLORIDE: 101 mmol/L (ref 98–107)
CO2 TOTAL: 27 mmol/L (ref 21–31)
CREATININE: 0.95 mg/dL (ref 0.60–1.30)
ESTIMATED GFR: 76 mL/min/1.73mˆ2 (ref >59)
GLOBULIN: 3.3 (ref 2.0–3.5)
GLUCOSE: 92 mg/dL (ref 74–109)
OSMOLALITY, CALCULATED: 278 mosm/kg (ref 270–290)
POTASSIUM: 3.6 mmol/L (ref 3.5–5.1)
PROTEIN TOTAL: 8 g/dL (ref 6.4–8.9)
SODIUM: 138 mmol/L (ref 136–145)

## 2024-07-02 LAB — CBC WITH DIFF
BASOPHIL #: 0 x10ˆ3/uL (ref 0.00–0.10)
BASOPHIL %: 1 % (ref 0–1)
EOSINOPHIL #: 0 x10ˆ3/uL (ref 0.00–0.50)
EOSINOPHIL %: 1 % (ref 1–7)
HCT: 46.1 % — ABNORMAL HIGH (ref 31.2–41.9)
HGB: 16.5 g/dL — ABNORMAL HIGH (ref 10.9–14.3)
LYMPHOCYTE #: 3 x10ˆ3/uL (ref 1.10–3.10)
LYMPHOCYTE %: 37 % (ref 16–46)
MCH: 33 pg — ABNORMAL HIGH (ref 24.7–32.8)
MCHC: 35.8 g/dL — ABNORMAL HIGH (ref 32.3–35.6)
MCV: 92.3 fL (ref 75.5–95.3)
MONOCYTE #: 0.6 x10ˆ3/uL (ref 0.20–0.90)
MONOCYTE %: 8 % (ref 4–11)
MPV: 7.5 fL — ABNORMAL LOW (ref 7.9–10.8)
NEUTROPHIL #: 4.3 x10ˆ3/uL (ref 1.90–8.20)
NEUTROPHIL %: 54 % (ref 43–77)
PLATELETS: 346 x10ˆ3/uL (ref 140–440)
RBC: 5 x10ˆ6/uL — ABNORMAL HIGH (ref 3.63–4.92)
RDW: 12.9 % (ref 12.3–17.7)
WBC: 8 x10ˆ3/uL (ref 3.8–11.8)

## 2024-07-02 LAB — LIPID PANEL
CHOL/HDL RATIO: 3.1
CHOLESTEROL: 200 mg/dL — ABNORMAL HIGH (ref <200)
HDL CHOL: 64 mg/dL (ref >=40)
LDL CALC: 119 mg/dL — ABNORMAL HIGH (ref 0–100)
TRIGLYCERIDES: 84 mg/dL (ref <=150)
VLDL CALC: 17 mg/dL (ref 0–50)

## 2024-07-05 ENCOUNTER — Ambulatory Visit (HOSPITAL_COMMUNITY): Payer: Self-pay

## 2024-07-06 ENCOUNTER — Ambulatory Visit (HOSPITAL_COMMUNITY): Payer: Self-pay

## 2024-07-15 ENCOUNTER — Other Ambulatory Visit: Payer: Self-pay

## 2024-07-15 ENCOUNTER — Ambulatory Visit
Admission: RE | Admit: 2024-07-15 | Discharge: 2024-07-15 | Disposition: A | Payer: Self-pay | Source: Ambulatory Visit | Attending: Family

## 2024-07-15 DIAGNOSIS — K760 Fatty (change of) liver, not elsewhere classified: Secondary | ICD-10-CM | POA: Insufficient documentation

## 2024-07-15 MED ORDER — GADOBUTROL 10 MMOL/10 ML (1 MMOL/ML) INTRAVENOUS SOLUTION
10.0000 mL | INTRAVENOUS | Status: AC
Start: 2024-07-15 — End: 2024-07-15
  Administered 2024-07-15: 10 mL via INTRAVENOUS

## 2024-07-17 DIAGNOSIS — K760 Fatty (change of) liver, not elsewhere classified: Secondary | ICD-10-CM

## 2024-07-28 ENCOUNTER — Ambulatory Visit (HOSPITAL_COMMUNITY): Payer: Self-pay

## 2024-08-04 ENCOUNTER — Ambulatory Visit

## 2024-08-04 ENCOUNTER — Ambulatory Visit (HOSPITAL_COMMUNITY)

## 2024-09-06 ENCOUNTER — Other Ambulatory Visit (HOSPITAL_COMMUNITY): Payer: Self-pay | Admitting: Family

## 2024-09-06 DIAGNOSIS — K760 Fatty (change of) liver, not elsewhere classified: Secondary | ICD-10-CM

## 2024-12-20 ENCOUNTER — Ambulatory Visit (HOSPITAL_BASED_OUTPATIENT_CLINIC_OR_DEPARTMENT_OTHER): Payer: Self-pay

## 2025-02-01 ENCOUNTER — Ambulatory Visit

## 2025-02-20 ENCOUNTER — Ambulatory Visit (HOSPITAL_BASED_OUTPATIENT_CLINIC_OR_DEPARTMENT_OTHER)
